# Patient Record
Sex: Male | Born: 1950 | Race: Black or African American | Hispanic: No | Marital: Married | Smoking: Never smoker
Health system: Southern US, Community
[De-identification: ages and names within clinical notes are randomized; demographics above are authoritative.]

## PROBLEM LIST (undated history)

## (undated) ENCOUNTER — Emergency Department: Payer: Medicare Other

## (undated) DIAGNOSIS — I71019 Dissection of thoracic aorta, unspecified: Secondary | ICD-10-CM

## (undated) DIAGNOSIS — N183 Chronic kidney disease, stage 3 unspecified: Secondary | ICD-10-CM

## (undated) DIAGNOSIS — E119 Type 2 diabetes mellitus without complications: Secondary | ICD-10-CM

## (undated) DIAGNOSIS — D689 Coagulation defect, unspecified: Secondary | ICD-10-CM

## (undated) DIAGNOSIS — G4730 Sleep apnea, unspecified: Secondary | ICD-10-CM

## (undated) DIAGNOSIS — R011 Cardiac murmur, unspecified: Secondary | ICD-10-CM

## (undated) DIAGNOSIS — I499 Cardiac arrhythmia, unspecified: Secondary | ICD-10-CM

## (undated) DIAGNOSIS — I10 Essential (primary) hypertension: Secondary | ICD-10-CM

## (undated) DIAGNOSIS — I7101 Dissection of thoracic aorta: Secondary | ICD-10-CM

## (undated) HISTORY — PX: REPAIR OF ACUTE ASCENDING THORACIC AORTIC DISSECTION: SHX6323

---

## 2014-09-16 DIAGNOSIS — Z95828 Presence of other vascular implants and grafts: Secondary | ICD-10-CM | POA: Insufficient documentation

## 2014-09-16 DIAGNOSIS — I7100 Dissection of unspecified site of aorta: Secondary | ICD-10-CM | POA: Insufficient documentation

## 2014-11-06 ENCOUNTER — Encounter (HOSPITAL_COMMUNITY)
Admission: RE | Admit: 2014-11-06 | Discharge: 2014-11-06 | Disposition: A | Discharge status: Home / Self Care | Payer: BLUE CROSS/BLUE SHIELD | Source: Ambulatory Visit | Attending: Cardiology | Admitting: Cardiology

## 2014-11-06 NOTE — Progress Notes (Signed)
Cardiac Rehab Medication Review by a Pharmacist  Does the patient  feel that his/her medications are working for him/her?  yes  Has the patient been experiencing any side effects to the medications prescribed?  no  Does the patient measure his/her own blood pressure or blood glucose at home?  yes   Does the patient have any problems obtaining medications due to transportation or finances?   no  Understanding of regimen: fair Understanding of indications: fair Potential of compliance: excellent    Pharmacist comments:  64 yo M presents to cardiac rehab accompanied by his wife. Patient in good spirits and explains that his wife handles all of his medications. Patient takes both his blood glucose and blood pressure every night. He explains that he doesn't have a good understanding of his regimen but his wife does. She verbalzies that she has a good system in place to help remember when to take all of his mediations. Medication list updated as well as allergies and pharmacy. All questions answered.  Thank you for allowing pharmacy to be part of this patient's care team  Junaid Wurzer M. Alianys Chacko, Pharm.D Clinical Pharmacy Resident Pager: [phone] 11/06/2014 .9:00 AM

## 2014-11-12 ENCOUNTER — Encounter (HOSPITAL_COMMUNITY): Payer: BLUE CROSS/BLUE SHIELD

## 2014-11-14 ENCOUNTER — Encounter (HOSPITAL_COMMUNITY): Payer: BLUE CROSS/BLUE SHIELD

## 2014-11-17 ENCOUNTER — Encounter (HOSPITAL_COMMUNITY)
Admission: RE | Admit: 2014-11-17 | Discharge: 2014-11-17 | Disposition: A | Discharge status: Home / Self Care | Payer: BLUE CROSS/BLUE SHIELD | Source: Ambulatory Visit | Attending: Cardiology | Admitting: Cardiology

## 2014-11-17 ENCOUNTER — Encounter (HOSPITAL_COMMUNITY): Payer: Self-pay

## 2014-11-17 DIAGNOSIS — I2699 Other pulmonary embolism without acute cor pulmonale: Secondary | ICD-10-CM | POA: Insufficient documentation

## 2014-11-17 DIAGNOSIS — G4733 Obstructive sleep apnea (adult) (pediatric): Secondary | ICD-10-CM | POA: Insufficient documentation

## 2014-11-17 DIAGNOSIS — Z95828 Presence of other vascular implants and grafts: Secondary | ICD-10-CM | POA: Diagnosis not present

## 2014-11-17 DIAGNOSIS — E785 Hyperlipidemia, unspecified: Secondary | ICD-10-CM | POA: Insufficient documentation

## 2014-11-17 DIAGNOSIS — I480 Paroxysmal atrial fibrillation: Secondary | ICD-10-CM

## 2014-11-17 DIAGNOSIS — I10 Essential (primary) hypertension: Secondary | ICD-10-CM

## 2014-11-17 DIAGNOSIS — I82409 Acute embolism and thrombosis of unspecified deep veins of unspecified lower extremity: Secondary | ICD-10-CM | POA: Insufficient documentation

## 2014-11-17 DIAGNOSIS — E1110 Type 2 diabetes mellitus with ketoacidosis without coma: Secondary | ICD-10-CM

## 2014-11-17 DIAGNOSIS — I7100 Dissection of unspecified site of aorta: Secondary | ICD-10-CM

## 2014-11-17 DIAGNOSIS — E119 Type 2 diabetes mellitus without complications: Secondary | ICD-10-CM | POA: Insufficient documentation

## 2014-11-17 DIAGNOSIS — I82401 Acute embolism and thrombosis of unspecified deep veins of right lower extremity: Secondary | ICD-10-CM

## 2014-11-17 DIAGNOSIS — I4891 Unspecified atrial fibrillation: Secondary | ICD-10-CM | POA: Insufficient documentation

## 2014-11-17 HISTORY — DX: Essential (primary) hypertension: I10

## 2014-11-17 HISTORY — DX: Cardiac arrhythmia, unspecified: I49.9

## 2014-11-17 HISTORY — DX: Coagulation defect, unspecified: D68.9

## 2014-11-17 HISTORY — DX: Type 2 diabetes mellitus without complications: E11.9

## 2014-11-17 LAB — GLUCOSE, CAPILLARY
Glucose-Capillary: 151 mg/dL — ABNORMAL HIGH (ref 70–99)
Glucose-Capillary: 129 mg/dL — ABNORMAL HIGH (ref 70–99)

## 2014-11-17 NOTE — Progress Notes (Signed)
Pt started cardiac rehab today.  Pt tolerated light exercise without difficulty.  VSS, telemetry-sinus rhythm, non specific STT changes, t wave inversion.  Asymptomatic.  PHQ-0.  Pt has no barriers to rehab participation, exhibits positive coping skills, hopeful outlook and supportive family.  Pt enjoys watching action movies for entertainment.  Pt goals for cardiac rehabilitation are to reduce cardiac risk factors, lower his diabetic medication dosage and lose weight, preferably about 15 pounds. Pt oriented to exercise equipment and routine.  Understanding verbalized.

## 2014-11-19 ENCOUNTER — Encounter (HOSPITAL_COMMUNITY)
Admission: RE | Admit: 2014-11-19 | Discharge: 2014-11-19 | Disposition: A | Discharge status: Home / Self Care | Payer: BLUE CROSS/BLUE SHIELD | Source: Ambulatory Visit | Attending: Cardiology | Admitting: Cardiology

## 2014-11-19 DIAGNOSIS — I7100 Dissection of unspecified site of aorta: Secondary | ICD-10-CM | POA: Diagnosis not present

## 2014-11-19 LAB — GLUCOSE, CAPILLARY
Glucose-Capillary: 101 mg/dL — ABNORMAL HIGH (ref 70–99)
Glucose-Capillary: 112 mg/dL — ABNORMAL HIGH (ref 70–99)

## 2014-11-19 NOTE — Progress Notes (Signed)
Pt reports his PCP is Jadene Pierini, MD.  This is who manages his diabetes.

## 2014-11-21 ENCOUNTER — Encounter (HOSPITAL_COMMUNITY): Payer: BLUE CROSS/BLUE SHIELD

## 2014-11-24 ENCOUNTER — Encounter (HOSPITAL_COMMUNITY)
Admission: RE | Admit: 2014-11-24 | Discharge: 2014-11-24 | Disposition: A | Discharge status: Home / Self Care | Payer: BLUE CROSS/BLUE SHIELD | Source: Ambulatory Visit | Attending: Cardiology | Admitting: Cardiology

## 2014-11-24 DIAGNOSIS — I7100 Dissection of unspecified site of aorta: Secondary | ICD-10-CM | POA: Diagnosis not present

## 2014-11-25 LAB — GLUCOSE, CAPILLARY
GLUCOSE-CAPILLARY: 122 mg/dL — AB (ref 70–99)
Glucose-Capillary: 113 mg/dL — ABNORMAL HIGH (ref 70–99)

## 2014-11-26 ENCOUNTER — Encounter (HOSPITAL_COMMUNITY)
Admission: RE | Admit: 2014-11-26 | Discharge: 2014-11-26 | Disposition: A | Discharge status: Home / Self Care | Payer: BLUE CROSS/BLUE SHIELD | Source: Ambulatory Visit | Attending: Cardiology | Admitting: Cardiology

## 2014-11-26 DIAGNOSIS — I7100 Dissection of unspecified site of aorta: Secondary | ICD-10-CM | POA: Diagnosis not present

## 2014-11-26 LAB — GLUCOSE, CAPILLARY: Glucose-Capillary: 123 mg/dL — ABNORMAL HIGH (ref 70–99)

## 2014-11-28 ENCOUNTER — Encounter (HOSPITAL_COMMUNITY)
Admission: RE | Admit: 2014-11-28 | Discharge: 2014-11-28 | Disposition: A | Discharge status: Home / Self Care | Payer: BLUE CROSS/BLUE SHIELD | Source: Ambulatory Visit | Attending: Cardiology | Admitting: Cardiology

## 2014-11-28 DIAGNOSIS — I7100 Dissection of unspecified site of aorta: Secondary | ICD-10-CM | POA: Diagnosis not present

## 2014-11-28 LAB — GLUCOSE, CAPILLARY: Glucose-Capillary: 138 mg/dL — ABNORMAL HIGH (ref 70–99)

## 2014-12-01 ENCOUNTER — Encounter (HOSPITAL_COMMUNITY): Payer: BLUE CROSS/BLUE SHIELD

## 2014-12-03 ENCOUNTER — Encounter (HOSPITAL_COMMUNITY)
Admission: RE | Admit: 2014-12-03 | Discharge: 2014-12-03 | Disposition: A | Discharge status: Home / Self Care | Payer: BLUE CROSS/BLUE SHIELD | Source: Ambulatory Visit | Attending: Cardiology | Admitting: Cardiology

## 2014-12-03 DIAGNOSIS — I7100 Dissection of unspecified site of aorta: Secondary | ICD-10-CM | POA: Diagnosis not present

## 2014-12-04 LAB — GLUCOSE, CAPILLARY: Glucose-Capillary: 176 mg/dL — ABNORMAL HIGH (ref 70–99)

## 2014-12-05 ENCOUNTER — Encounter (HOSPITAL_COMMUNITY)
Admission: RE | Admit: 2014-12-05 | Discharge: 2014-12-05 | Disposition: A | Discharge status: Home / Self Care | Payer: BLUE CROSS/BLUE SHIELD | Source: Ambulatory Visit | Attending: Cardiology | Admitting: Cardiology

## 2014-12-05 DIAGNOSIS — I7100 Dissection of unspecified site of aorta: Secondary | ICD-10-CM | POA: Diagnosis not present

## 2014-12-08 ENCOUNTER — Encounter (HOSPITAL_COMMUNITY)
Admission: RE | Admit: 2014-12-08 | Discharge: 2014-12-08 | Disposition: A | Discharge status: Home / Self Care | Payer: BLUE CROSS/BLUE SHIELD | Source: Ambulatory Visit | Attending: Cardiology | Admitting: Cardiology

## 2014-12-08 DIAGNOSIS — I7100 Dissection of unspecified site of aorta: Secondary | ICD-10-CM | POA: Diagnosis not present

## 2014-12-08 LAB — GLUCOSE, CAPILLARY: Glucose-Capillary: 108 mg/dL — ABNORMAL HIGH (ref 70–99)

## 2014-12-10 ENCOUNTER — Encounter (HOSPITAL_COMMUNITY)
Admission: RE | Admit: 2014-12-10 | Discharge: 2014-12-10 | Disposition: A | Discharge status: Home / Self Care | Payer: BLUE CROSS/BLUE SHIELD | Source: Ambulatory Visit | Attending: Cardiology | Admitting: Cardiology

## 2014-12-10 DIAGNOSIS — I7100 Dissection of unspecified site of aorta: Secondary | ICD-10-CM | POA: Diagnosis not present

## 2014-12-10 NOTE — Progress Notes (Signed)
PSYCHOSOCIAL ASSESSMENT  Pt psychosocial assessment reveals no barriers to rehab participation.  Pt quality of life is slightly altered by his physical constraints which limits his ability to perform tasks as prior to his illness. However pt reports since he has been exercising at cardiac rehab some of these symptoms have resolved or improved. Pt reports increased appetite, improved sleep quality and more energy for activities.    Pt exhibits positive coping skills and has supportive family.  Offered emotional support and reassurance.  Will continue to monitor.

## 2014-12-12 ENCOUNTER — Encounter (HOSPITAL_COMMUNITY): Payer: BLUE CROSS/BLUE SHIELD

## 2014-12-12 DIAGNOSIS — E669 Obesity, unspecified: Secondary | ICD-10-CM | POA: Insufficient documentation

## 2014-12-15 ENCOUNTER — Encounter (HOSPITAL_COMMUNITY)
Admission: RE | Admit: 2014-12-15 | Discharge: 2014-12-15 | Disposition: A | Discharge status: Home / Self Care | Payer: BLUE CROSS/BLUE SHIELD | Source: Ambulatory Visit | Attending: Cardiology | Admitting: Cardiology

## 2014-12-15 DIAGNOSIS — I7100 Dissection of unspecified site of aorta: Secondary | ICD-10-CM | POA: Diagnosis not present

## 2014-12-17 ENCOUNTER — Encounter (HOSPITAL_COMMUNITY)
Admission: RE | Admit: 2014-12-17 | Discharge: 2014-12-17 | Disposition: A | Discharge status: Home / Self Care | Payer: BLUE CROSS/BLUE SHIELD | Source: Ambulatory Visit | Attending: Cardiology | Admitting: Cardiology

## 2014-12-17 DIAGNOSIS — I7100 Dissection of unspecified site of aorta: Secondary | ICD-10-CM | POA: Insufficient documentation

## 2014-12-17 DIAGNOSIS — Z95828 Presence of other vascular implants and grafts: Secondary | ICD-10-CM | POA: Insufficient documentation

## 2014-12-17 DIAGNOSIS — I480 Paroxysmal atrial fibrillation: Secondary | ICD-10-CM | POA: Insufficient documentation

## 2014-12-17 DIAGNOSIS — I10 Essential (primary) hypertension: Secondary | ICD-10-CM | POA: Diagnosis not present

## 2014-12-17 DIAGNOSIS — E785 Hyperlipidemia, unspecified: Secondary | ICD-10-CM | POA: Insufficient documentation

## 2014-12-19 ENCOUNTER — Encounter (HOSPITAL_COMMUNITY)
Admission: RE | Admit: 2014-12-19 | Discharge: 2014-12-19 | Disposition: A | Discharge status: Home / Self Care | Payer: BLUE CROSS/BLUE SHIELD | Source: Ambulatory Visit | Attending: Cardiology | Admitting: Cardiology

## 2014-12-19 DIAGNOSIS — I7100 Dissection of unspecified site of aorta: Secondary | ICD-10-CM | POA: Diagnosis not present

## 2014-12-22 ENCOUNTER — Encounter (HOSPITAL_COMMUNITY)
Admission: RE | Admit: 2014-12-22 | Discharge: 2014-12-22 | Disposition: A | Discharge status: Home / Self Care | Payer: BLUE CROSS/BLUE SHIELD | Source: Ambulatory Visit | Attending: Cardiology | Admitting: Cardiology

## 2014-12-22 DIAGNOSIS — I7100 Dissection of unspecified site of aorta: Secondary | ICD-10-CM | POA: Diagnosis not present

## 2014-12-22 LAB — GLUCOSE, CAPILLARY
GLUCOSE-CAPILLARY: 86 mg/dL (ref 70–99)
Glucose-Capillary: 113 mg/dL — ABNORMAL HIGH (ref 70–99)

## 2014-12-22 NOTE — Progress Notes (Signed)
Benjamin Blair 64 y.o. male Nutrition Note Spoke with pt.  Nutrition Plan and Nutrition Survey goals reviewed with pt. Pt is working toward following the Therapeutic Lifestyle Changes diet. Pt wants to lose wt "and get off some of these medications." Pt has been trying to lose wt by changing his diet and exercising. Wt loss tips reviewed.  Pt is diabetic. Last A1c indicates blood glucose not optimally controlled. Pt checks post-prandial CBG's daily, which are "around 110 mg/dL 1-2 hours after eating." This writer went over Diabetes Education test results. Pt is on Coumadin and follows a diet consistent in vitamin K per discussion. Pt expressed understanding of the information reviewed. Pt aware of nutrition education classes offered and is unable to attend nutrition classes.  Nutrition Diagnosis ? Food-and nutrition-related knowledge deficit related to lack of exposure to information as related to diagnosis of: ? CVD ? DM (A1c 7.4) ? Obesity related to excessive energy intake as evidenced by a BMI of 31.4  Nutrition RX/ Estimated Daily Nutrition Needs for: wt loss  1800-2300 Kcal, 45-60 gm fat, 11-15 gm sat fat, 1.7-2.3 gm trans-fat, <1500 mg sodium, 250 gm CHO   Nutrition Intervention ? Pt's individual nutrition plan reviewed with pt. ? Benefits of adopting Therapeutic Lifestyle Changes discussed when Medficts reviewed. ? Pt to attend the Portion Distortion class ? Pt to attend the Diabetes Q& A class ? Pt given handouts for: ? Nutrition I class ? Nutrition II class ? Diabetes Blitz class ? Continue client-centered nutrition education by RD, as part of interdisciplinary care. Goal(s) ? Pt to identify and limit food sources of saturated fat, trans fat, and cholesterol ? Pt to identify food quantities necessary to achieve: ? wt loss to a goal wt of 220-238 lb (100.2-108.4 kg) at graduation from cardiac rehab.  ? CBG concentrations in the normal range or as close to normal as is safely  possible. Monitor and Evaluate progress toward nutrition goal with team. Nutrition Risk: Change to Moderate Benjamin Blair, M.Ed, RD, LDN, CDE 12/22/2014 3:58 PM

## 2014-12-24 ENCOUNTER — Encounter (HOSPITAL_COMMUNITY)
Admission: RE | Admit: 2014-12-24 | Discharge: 2014-12-24 | Disposition: A | Discharge status: Home / Self Care | Payer: BLUE CROSS/BLUE SHIELD | Source: Ambulatory Visit | Attending: Cardiology | Admitting: Cardiology

## 2014-12-24 DIAGNOSIS — I7100 Dissection of unspecified site of aorta: Secondary | ICD-10-CM | POA: Diagnosis not present

## 2014-12-26 ENCOUNTER — Encounter (HOSPITAL_COMMUNITY)
Admission: RE | Admit: 2014-12-26 | Discharge: 2014-12-26 | Disposition: A | Discharge status: Home / Self Care | Payer: BLUE CROSS/BLUE SHIELD | Source: Ambulatory Visit | Attending: Cardiology | Admitting: Cardiology

## 2014-12-26 DIAGNOSIS — I7100 Dissection of unspecified site of aorta: Secondary | ICD-10-CM | POA: Diagnosis not present

## 2014-12-29 ENCOUNTER — Encounter (HOSPITAL_COMMUNITY)
Admission: RE | Admit: 2014-12-29 | Discharge: 2014-12-29 | Disposition: A | Discharge status: Home / Self Care | Payer: BLUE CROSS/BLUE SHIELD | Source: Ambulatory Visit | Attending: Cardiology | Admitting: Cardiology

## 2014-12-29 DIAGNOSIS — I7100 Dissection of unspecified site of aorta: Secondary | ICD-10-CM | POA: Diagnosis not present

## 2014-12-31 ENCOUNTER — Encounter (HOSPITAL_COMMUNITY)
Admission: RE | Admit: 2014-12-31 | Discharge: 2014-12-31 | Disposition: A | Discharge status: Home / Self Care | Payer: BLUE CROSS/BLUE SHIELD | Source: Ambulatory Visit | Attending: Cardiology | Admitting: Cardiology

## 2014-12-31 ENCOUNTER — Ambulatory Visit: Payer: BLUE CROSS/BLUE SHIELD | Admitting: Podiatry

## 2014-12-31 DIAGNOSIS — I7100 Dissection of unspecified site of aorta: Secondary | ICD-10-CM | POA: Diagnosis not present

## 2015-01-02 ENCOUNTER — Encounter (HOSPITAL_COMMUNITY): Payer: BLUE CROSS/BLUE SHIELD

## 2015-01-02 ENCOUNTER — Encounter (HOSPITAL_COMMUNITY)
Admission: RE | Admit: 2015-01-02 | Discharge: 2015-01-02 | Disposition: A | Discharge status: Home / Self Care | Payer: BLUE CROSS/BLUE SHIELD | Source: Ambulatory Visit | Attending: Cardiology | Admitting: Cardiology

## 2015-01-02 DIAGNOSIS — I7100 Dissection of unspecified site of aorta: Secondary | ICD-10-CM | POA: Diagnosis not present

## 2015-01-05 ENCOUNTER — Encounter (HOSPITAL_COMMUNITY)
Admission: RE | Admit: 2015-01-05 | Discharge: 2015-01-05 | Disposition: A | Discharge status: Home / Self Care | Payer: BLUE CROSS/BLUE SHIELD | Source: Ambulatory Visit | Attending: Cardiology | Admitting: Cardiology

## 2015-01-05 DIAGNOSIS — I7100 Dissection of unspecified site of aorta: Secondary | ICD-10-CM | POA: Diagnosis not present

## 2015-01-07 ENCOUNTER — Encounter (HOSPITAL_COMMUNITY)
Admission: RE | Admit: 2015-01-07 | Discharge: 2015-01-07 | Disposition: A | Discharge status: Home / Self Care | Payer: BLUE CROSS/BLUE SHIELD | Source: Ambulatory Visit | Attending: Cardiology | Admitting: Cardiology

## 2015-01-07 DIAGNOSIS — I7100 Dissection of unspecified site of aorta: Secondary | ICD-10-CM | POA: Diagnosis not present

## 2015-01-09 ENCOUNTER — Encounter (HOSPITAL_COMMUNITY)
Admission: RE | Admit: 2015-01-09 | Discharge: 2015-01-09 | Disposition: A | Discharge status: Home / Self Care | Payer: BLUE CROSS/BLUE SHIELD | Source: Ambulatory Visit | Attending: Cardiology | Admitting: Cardiology

## 2015-01-09 DIAGNOSIS — I7100 Dissection of unspecified site of aorta: Secondary | ICD-10-CM | POA: Diagnosis not present

## 2015-01-12 ENCOUNTER — Encounter (HOSPITAL_COMMUNITY)
Admission: RE | Admit: 2015-01-12 | Discharge: 2015-01-12 | Disposition: A | Discharge status: Home / Self Care | Payer: BLUE CROSS/BLUE SHIELD | Source: Ambulatory Visit | Attending: Cardiology | Admitting: Cardiology

## 2015-01-12 DIAGNOSIS — I7100 Dissection of unspecified site of aorta: Secondary | ICD-10-CM | POA: Diagnosis not present

## 2015-01-12 LAB — GLUCOSE, CAPILLARY: GLUCOSE-CAPILLARY: 103 mg/dL — AB (ref 70–99)

## 2015-01-14 ENCOUNTER — Encounter (HOSPITAL_COMMUNITY)
Admission: RE | Admit: 2015-01-14 | Discharge: 2015-01-14 | Disposition: A | Discharge status: Home / Self Care | Payer: BLUE CROSS/BLUE SHIELD | Source: Ambulatory Visit | Attending: Cardiology | Admitting: Cardiology

## 2015-01-14 DIAGNOSIS — I7100 Dissection of unspecified site of aorta: Secondary | ICD-10-CM | POA: Diagnosis not present

## 2015-01-16 ENCOUNTER — Encounter (HOSPITAL_COMMUNITY)
Admission: RE | Admit: 2015-01-16 | Discharge: 2015-01-16 | Disposition: A | Discharge status: Home / Self Care | Payer: BLUE CROSS/BLUE SHIELD | Source: Ambulatory Visit | Attending: Cardiology | Admitting: Cardiology

## 2015-01-16 DIAGNOSIS — Z95828 Presence of other vascular implants and grafts: Secondary | ICD-10-CM | POA: Diagnosis not present

## 2015-01-16 DIAGNOSIS — I7100 Dissection of unspecified site of aorta: Secondary | ICD-10-CM | POA: Insufficient documentation

## 2015-01-16 DIAGNOSIS — I480 Paroxysmal atrial fibrillation: Secondary | ICD-10-CM | POA: Insufficient documentation

## 2015-01-16 DIAGNOSIS — I10 Essential (primary) hypertension: Secondary | ICD-10-CM | POA: Diagnosis not present

## 2015-01-16 DIAGNOSIS — E785 Hyperlipidemia, unspecified: Secondary | ICD-10-CM | POA: Insufficient documentation

## 2015-01-19 ENCOUNTER — Encounter (HOSPITAL_COMMUNITY)
Admission: RE | Admit: 2015-01-19 | Discharge: 2015-01-19 | Disposition: A | Discharge status: Home / Self Care | Payer: BLUE CROSS/BLUE SHIELD | Source: Ambulatory Visit | Attending: Cardiology | Admitting: Cardiology

## 2015-01-19 DIAGNOSIS — I7100 Dissection of unspecified site of aorta: Secondary | ICD-10-CM | POA: Diagnosis not present

## 2015-01-21 ENCOUNTER — Encounter (HOSPITAL_COMMUNITY)
Admission: RE | Admit: 2015-01-21 | Discharge: 2015-01-21 | Disposition: A | Discharge status: Home / Self Care | Payer: BLUE CROSS/BLUE SHIELD | Source: Ambulatory Visit | Attending: Cardiology | Admitting: Cardiology

## 2015-01-21 DIAGNOSIS — I7100 Dissection of unspecified site of aorta: Secondary | ICD-10-CM | POA: Diagnosis not present

## 2015-01-23 ENCOUNTER — Encounter (HOSPITAL_COMMUNITY)
Admission: RE | Admit: 2015-01-23 | Discharge: 2015-01-23 | Disposition: A | Discharge status: Home / Self Care | Payer: BLUE CROSS/BLUE SHIELD | Source: Ambulatory Visit | Attending: Cardiology | Admitting: Cardiology

## 2015-01-23 DIAGNOSIS — I7100 Dissection of unspecified site of aorta: Secondary | ICD-10-CM | POA: Diagnosis not present

## 2015-01-26 ENCOUNTER — Encounter (HOSPITAL_COMMUNITY)
Admission: RE | Admit: 2015-01-26 | Discharge: 2015-01-26 | Disposition: A | Discharge status: Home / Self Care | Payer: BLUE CROSS/BLUE SHIELD | Source: Ambulatory Visit | Attending: Cardiology | Admitting: Cardiology

## 2015-01-26 DIAGNOSIS — I7100 Dissection of unspecified site of aorta: Secondary | ICD-10-CM | POA: Diagnosis not present

## 2015-01-28 ENCOUNTER — Encounter (HOSPITAL_COMMUNITY)
Admission: RE | Admit: 2015-01-28 | Discharge: 2015-01-28 | Disposition: A | Discharge status: Home / Self Care | Payer: BLUE CROSS/BLUE SHIELD | Source: Ambulatory Visit | Attending: Cardiology | Admitting: Cardiology

## 2015-01-28 DIAGNOSIS — I7100 Dissection of unspecified site of aorta: Secondary | ICD-10-CM | POA: Diagnosis not present

## 2015-01-28 LAB — GLUCOSE, CAPILLARY: GLUCOSE-CAPILLARY: 106 mg/dL — AB (ref 70–99)

## 2015-01-30 ENCOUNTER — Encounter (HOSPITAL_COMMUNITY)
Admission: RE | Admit: 2015-01-30 | Discharge: 2015-01-30 | Disposition: A | Discharge status: Home / Self Care | Payer: BLUE CROSS/BLUE SHIELD | Source: Ambulatory Visit | Attending: Cardiology | Admitting: Cardiology

## 2015-01-30 DIAGNOSIS — I7100 Dissection of unspecified site of aorta: Secondary | ICD-10-CM | POA: Diagnosis not present

## 2015-01-30 LAB — GLUCOSE, CAPILLARY: Glucose-Capillary: 137 mg/dL — ABNORMAL HIGH (ref 70–99)

## 2015-02-02 ENCOUNTER — Encounter (HOSPITAL_COMMUNITY)
Admission: RE | Admit: 2015-02-02 | Discharge: 2015-02-02 | Disposition: A | Discharge status: Home / Self Care | Payer: BLUE CROSS/BLUE SHIELD | Source: Ambulatory Visit | Attending: Cardiology | Admitting: Cardiology

## 2015-02-02 DIAGNOSIS — I7100 Dissection of unspecified site of aorta: Secondary | ICD-10-CM | POA: Diagnosis not present

## 2015-02-02 LAB — GLUCOSE, CAPILLARY: Glucose-Capillary: 140 mg/dL — ABNORMAL HIGH (ref 70–99)

## 2015-02-04 ENCOUNTER — Encounter (HOSPITAL_COMMUNITY)
Admission: RE | Admit: 2015-02-04 | Discharge: 2015-02-04 | Disposition: A | Discharge status: Home / Self Care | Payer: BLUE CROSS/BLUE SHIELD | Source: Ambulatory Visit | Attending: Cardiology | Admitting: Cardiology

## 2015-02-04 DIAGNOSIS — I7100 Dissection of unspecified site of aorta: Secondary | ICD-10-CM | POA: Diagnosis not present

## 2015-02-04 LAB — GLUCOSE, CAPILLARY: Glucose-Capillary: 87 mg/dL (ref 70–99)

## 2015-02-06 ENCOUNTER — Encounter (HOSPITAL_COMMUNITY)
Admission: RE | Admit: 2015-02-06 | Discharge: 2015-02-06 | Disposition: A | Discharge status: Home / Self Care | Payer: BLUE CROSS/BLUE SHIELD | Source: Ambulatory Visit | Attending: Cardiology | Admitting: Cardiology

## 2015-02-06 DIAGNOSIS — I7100 Dissection of unspecified site of aorta: Secondary | ICD-10-CM | POA: Diagnosis not present

## 2015-02-09 ENCOUNTER — Encounter (HOSPITAL_COMMUNITY)
Admission: RE | Admit: 2015-02-09 | Discharge: 2015-02-09 | Disposition: A | Discharge status: Home / Self Care | Payer: BLUE CROSS/BLUE SHIELD | Source: Ambulatory Visit | Attending: Interventional Cardiology | Admitting: Interventional Cardiology

## 2015-02-09 DIAGNOSIS — I7100 Dissection of unspecified site of aorta: Secondary | ICD-10-CM | POA: Diagnosis not present

## 2015-02-09 LAB — GLUCOSE, CAPILLARY
GLUCOSE-CAPILLARY: 88 mg/dL (ref 70–99)
Glucose-Capillary: 88 mg/dL (ref 70–99)

## 2015-02-11 ENCOUNTER — Encounter (HOSPITAL_COMMUNITY)
Admission: RE | Admit: 2015-02-11 | Discharge: 2015-02-11 | Disposition: A | Discharge status: Home / Self Care | Payer: BLUE CROSS/BLUE SHIELD | Source: Ambulatory Visit | Attending: Cardiology | Admitting: Cardiology

## 2015-02-11 DIAGNOSIS — I7100 Dissection of unspecified site of aorta: Secondary | ICD-10-CM | POA: Diagnosis not present

## 2015-02-11 LAB — GLUCOSE, CAPILLARY: Glucose-Capillary: 146 mg/dL — ABNORMAL HIGH (ref 70–99)

## 2015-02-13 ENCOUNTER — Encounter (HOSPITAL_COMMUNITY): Payer: Self-pay

## 2015-02-13 ENCOUNTER — Encounter (HOSPITAL_COMMUNITY)
Admission: RE | Admit: 2015-02-13 | Discharge: 2015-02-13 | Disposition: A | Discharge status: Home / Self Care | Payer: BLUE CROSS/BLUE SHIELD | Source: Ambulatory Visit | Attending: Cardiology | Admitting: Cardiology

## 2015-02-13 DIAGNOSIS — I7100 Dissection of unspecified site of aorta: Secondary | ICD-10-CM | POA: Diagnosis not present

## 2015-02-13 LAB — GLUCOSE, CAPILLARY: Glucose-Capillary: 110 mg/dL — ABNORMAL HIGH (ref 70–99)

## 2015-02-13 NOTE — Progress Notes (Signed)
Pt graduated from cardiac rehab program today with completion of 36 exercise sessions in Phase II. Pt maintained good attendance and progressed nicely during his participation in rehab as evidenced by increased MET level.   Medication list reconciled. Repeat  PSYSHOSOCIAL ASSESSMENT:  PHQ score-0.  No needs identified, no interventions necessary.   Pt has made significant lifestyle changes and should be commended for his success. Pt feels he has achieved his goals during cardiac rehab, especially weight loss(20 lbs!) and increased stregth/stamina and endurance. Pt also hopes he has been able to lower his HgbAIC which will be reassessed at scheduled June PCP appt.   Pt congratulated on his success and encouraged to continue his lifestyle modifications and exercise routine.    Pt plans to continue exercise on his own at home.

## 2015-02-16 ENCOUNTER — Encounter (HOSPITAL_COMMUNITY): Payer: BLUE CROSS/BLUE SHIELD

## 2015-07-20 DIAGNOSIS — N401 Enlarged prostate with lower urinary tract symptoms: Secondary | ICD-10-CM | POA: Insufficient documentation

## 2015-07-20 DIAGNOSIS — R32 Unspecified urinary incontinence: Secondary | ICD-10-CM | POA: Insufficient documentation

## 2015-07-20 DIAGNOSIS — N529 Male erectile dysfunction, unspecified: Secondary | ICD-10-CM | POA: Insufficient documentation

## 2015-08-03 ENCOUNTER — Ambulatory Visit: Payer: BLUE CROSS/BLUE SHIELD | Admitting: Podiatry

## 2015-08-03 ENCOUNTER — Ambulatory Visit (INDEPENDENT_AMBULATORY_CARE_PROVIDER_SITE_OTHER): Payer: BLUE CROSS/BLUE SHIELD | Admitting: Podiatry

## 2015-08-03 ENCOUNTER — Encounter: Payer: Self-pay | Admitting: Podiatry

## 2015-08-03 DIAGNOSIS — M79676 Pain in unspecified toe(s): Secondary | ICD-10-CM | POA: Diagnosis not present

## 2015-08-03 DIAGNOSIS — B351 Tinea unguium: Secondary | ICD-10-CM

## 2015-08-03 NOTE — Patient Instructions (Signed)
Diabetes and Foot Care Diabetes may cause you to have problems because of poor blood supply (circulation) to your feet and legs. This may cause the skin on your feet to become thinner, break easier, and heal more slowly. Your skin may become dry, and the skin may peel and crack. You may also have nerve damage in your legs and feet causing decreased feeling in them. You may not notice minor injuries to your feet that could lead to infections or more serious problems. Taking care of your feet is one of the most important things you can do for yourself.  HOME CARE INSTRUCTIONS  Wear shoes at all times, even in the house. Do not go barefoot. Bare feet are easily injured.  Check your feet daily for blisters, cuts, and redness. If you cannot see the bottom of your feet, use a mirror or ask someone for help.  Wash your feet with warm water (do not use hot water) and mild soap. Then pat your feet and the areas between your toes until they are completely dry. Do not soak your feet as this can dry your skin.  Apply a moisturizing lotion or petroleum jelly (that does not contain alcohol and is unscented) to the skin on your feet and to dry, brittle toenails. Do not apply lotion between your toes.  Trim your toenails straight across. Do not dig under them or around the cuticle. File the edges of your nails with an emery board or nail file.  Do not cut corns or calluses or try to remove them with medicine.  Wear clean socks or stockings every day. Make sure they are not too tight. Do not wear knee-high stockings since they may decrease blood flow to your legs.  Wear shoes that fit properly and have enough cushioning. To break in new shoes, wear them for just a few hours a day. This prevents you from injuring your feet. Always look in your shoes before you put them on to be sure there are no objects inside.  Do not cross your legs. This may decrease the blood flow to your feet.  If you find a minor scrape,  cut, or break in the skin on your feet, keep it and the skin around it clean and dry. These areas may be cleansed with mild soap and water. Do not cleanse the area with peroxide, alcohol, or iodine.  When you remove an adhesive bandage, be sure not to damage the skin around it.  If you have a wound, look at it several times a day to make sure it is healing.  Do not use heating pads or hot water bottles. They may burn your skin. If you have lost feeling in your feet or legs, you may not know it is happening until it is too late.  Make sure your health care provider performs a complete foot exam at least annually or more often if you have foot problems. Report any cuts, sores, or bruises to your health care provider immediately. SEEK MEDICAL CARE IF:   You have an injury that is not healing.  You have cuts or breaks in the skin.  You have an ingrown nail.  You notice redness on your legs or feet.  You feel burning or tingling in your legs or feet.  You have pain or cramps in your legs and feet.  Your legs or feet are numb.  Your feet always feel cold. SEEK IMMEDIATE MEDICAL CARE IF:   There is increasing redness,   swelling, or pain in or around a wound.  There is a red line that goes up your leg.  Pus is coming from a wound.  You develop a fever or as directed by your health care provider.  You notice a bad smell coming from an ulcer or wound.   This information is not intended to replace advice given to you by your health care provider. Make sure you discuss any questions you have with your health care provider.   Document Released: 09/30/2000 Document Revised: 06/05/2013 Document Reviewed: 03/12/2013 Elsevier Interactive Patient Education 2016 Elsevier Inc.  

## 2015-08-04 NOTE — Progress Notes (Signed)
Subjective:     Patient ID: Benjamin Blair, male   DOB: 1951-07-07, 64 y.o.   MRN: [id number]  HPI Patient presents the office today with concerns of thick, painful, elongated toenails which she is unable to trim himself. He denies any redness or drainage from around the nail sites. The nails are painful particularly with pressure in shoe gear. He is diabetic and states his blood sugar has been controlled. He denies any claudication symptoms and denies any tingling or numbness. No history of ulceration. No other complaints at this time.  Review of Systems  All other systems reviewed and are negative.      Objective:   Physical Exam  AAO 3, NAD DP/PT pulses 2/4, CRT less than 3 seconds Protective sensation intact with Simms Weinstein monofilament, vibratory sensation intact, Achilles tendon reflex intact. Nails are hypertrophic, dystrophic, brittle, discolored, elongated 10. There is tenderness to palpation overlying nails 1-5 bilaterally. No surrounding erythema or drainage. No open lesions or pre-ulcerative lesions identified bilaterally. No other areas of tenderness to bilateral lower extremity. MMT 5/5, ROM WNL No pain with calf compression, swelling, warmth, erythema.    Assessment:     64 year old male with symptomatic onychomycosis    Plan:     -Treatment options discussed including all alternatives, risks, and complications -Nail sharply debrided 10 without complication/bleeding. -Discussed the importance of daily foot inspection. -Follow-up in 3 months or sooner if any problems arise. In the meantime, encouraged to call the office with any questions, concerns, change in symptoms.   Celesta Gentile, DPM

## 2015-08-17 DIAGNOSIS — N3946 Mixed incontinence: Secondary | ICD-10-CM | POA: Insufficient documentation

## 2015-08-24 ENCOUNTER — Emergency Department (HOSPITAL_COMMUNITY)
Admission: EM | Admit: 2015-08-24 | Discharge: 2015-08-24 | Disposition: A | Discharge status: Home / Self Care | Payer: BLUE CROSS/BLUE SHIELD | Attending: Emergency Medicine | Admitting: Emergency Medicine

## 2015-08-24 ENCOUNTER — Encounter (HOSPITAL_COMMUNITY): Payer: Self-pay | Admitting: Emergency Medicine

## 2015-08-24 DIAGNOSIS — X58XXXA Exposure to other specified factors, initial encounter: Secondary | ICD-10-CM | POA: Insufficient documentation

## 2015-08-24 DIAGNOSIS — T7840XA Allergy, unspecified, initial encounter: Secondary | ICD-10-CM | POA: Insufficient documentation

## 2015-08-24 DIAGNOSIS — I4891 Unspecified atrial fibrillation: Secondary | ICD-10-CM | POA: Insufficient documentation

## 2015-08-24 DIAGNOSIS — Z86711 Personal history of pulmonary embolism: Secondary | ICD-10-CM | POA: Insufficient documentation

## 2015-08-24 DIAGNOSIS — Z7901 Long term (current) use of anticoagulants: Secondary | ICD-10-CM | POA: Insufficient documentation

## 2015-08-24 DIAGNOSIS — Z86718 Personal history of other venous thrombosis and embolism: Secondary | ICD-10-CM | POA: Insufficient documentation

## 2015-08-24 DIAGNOSIS — Z7982 Long term (current) use of aspirin: Secondary | ICD-10-CM | POA: Insufficient documentation

## 2015-08-24 DIAGNOSIS — L500 Allergic urticaria: Secondary | ICD-10-CM | POA: Insufficient documentation

## 2015-08-24 DIAGNOSIS — Z79899 Other long term (current) drug therapy: Secondary | ICD-10-CM | POA: Insufficient documentation

## 2015-08-24 DIAGNOSIS — L509 Urticaria, unspecified: Secondary | ICD-10-CM

## 2015-08-24 DIAGNOSIS — I10 Essential (primary) hypertension: Secondary | ICD-10-CM | POA: Insufficient documentation

## 2015-08-24 DIAGNOSIS — E119 Type 2 diabetes mellitus without complications: Secondary | ICD-10-CM | POA: Insufficient documentation

## 2015-08-24 LAB — CBC WITH DIFFERENTIAL/PLATELET
Basophils Absolute: 0 10*3/uL (ref 0.0–0.1)
Basophils Relative: 0 %
EOS PCT: 2 %
Eosinophils Absolute: 0.1 10*3/uL (ref 0.0–0.7)
HCT: 39.6 % (ref 39.0–52.0)
HEMOGLOBIN: 12.5 g/dL — AB (ref 13.0–17.0)
LYMPHS ABS: 1 10*3/uL (ref 0.7–4.0)
LYMPHS PCT: 18 %
MCH: 25.3 pg — AB (ref 26.0–34.0)
MCHC: 31.6 g/dL (ref 30.0–36.0)
MCV: 80 fL (ref 78.0–100.0)
MONOS PCT: 11 %
Monocytes Absolute: 0.6 10*3/uL (ref 0.1–1.0)
Neutro Abs: 4 10*3/uL (ref 1.7–7.7)
Neutrophils Relative %: 69 %
Platelets: 232 10*3/uL (ref 150–400)
RBC: 4.95 MIL/uL (ref 4.22–5.81)
RDW: 16 % — AB (ref 11.5–15.5)
WBC: 5.8 10*3/uL (ref 4.0–10.5)

## 2015-08-24 LAB — BASIC METABOLIC PANEL
Anion gap: 7 (ref 5–15)
BUN: 21 mg/dL — ABNORMAL HIGH (ref 6–20)
CHLORIDE: 108 mmol/L (ref 101–111)
CO2: 24 mmol/L (ref 22–32)
Calcium: 8.8 mg/dL — ABNORMAL LOW (ref 8.9–10.3)
Creatinine, Ser: 1.89 mg/dL — ABNORMAL HIGH (ref 0.61–1.24)
GFR calc non Af Amer: 36 mL/min — ABNORMAL LOW (ref >60)
GFR, EST AFRICAN AMERICAN: 42 mL/min — AB (ref >60)
Glucose, Bld: 104 mg/dL — ABNORMAL HIGH (ref 65–99)
POTASSIUM: 4.3 mmol/L (ref 3.5–5.1)
SODIUM: 139 mmol/L (ref 135–145)

## 2015-08-24 LAB — PROTIME-INR
INR: 2.19 — ABNORMAL HIGH (ref 0.00–1.49)
Prothrombin Time: 24.2 seconds — ABNORMAL HIGH (ref 11.6–15.2)

## 2015-08-24 MED ORDER — DIPHENHYDRAMINE HCL 25 MG PO TABS
50.0000 mg | ORAL_TABLET | Freq: Four times a day (QID) | ORAL | Status: AC | PRN
Start: 2015-08-24 — End: ?

## 2015-08-24 NOTE — Discharge Instructions (Signed)
Please take Benadryl as needed for recurrent itchiness and hives. Return without fail for worsening symptoms, including difficulty breathing, chest pain, passing out, swelling in your throat or tongue, or any other symptoms concerning to  You. Please discuss with your primary care physician about prescribing a different medication for your diabetes. Stop taking your Tradjenta as it may be causing your symptoms. Hives Hives are itchy, red, puffy (swollen) areas of the skin. Hives can change in size and location on your body. Hives can come and go for hours, days, or weeks. Hives do not spread from person to person (noncontagious). Scratching, exercise, and stress can make your hives worse. HOME CARE  Avoid things that cause your hives (triggers).  Take antihistamine medicines as told by your doctor. Do not drive while taking an antihistamine.  Take any other medicines for itching as told by your doctor.  Wear loose-fitting clothing.  Keep all doctor visits as told. GET HELP RIGHT AWAY IF:   You have a fever.  Your tongue or lips are puffy.  You have trouble breathing or swallowing.  You feel tightness in the throat or chest.  You have belly (abdominal) pain.  You have lasting or severe itching that is not helped by medicine.  You have painful or puffy joints. These problems may be the first sign of a life-threatening allergic reaction. Call your local emergency services (911 in [place] MAKE SURE YOU:   Understand these instructions.  Will watch your condition.  Will get help right away if you are not doing well or get worse.   This information is not intended to replace advice given to you by your health care provider. Make sure you discuss any questions you have with your health care provider.   Document Released: 07/12/2008 Document Revised: 04/03/2012 Document Reviewed: 12/27/2011 Elsevier Interactive Patient Education [employer].

## 2015-08-24 NOTE — ED Provider Notes (Signed)
CSN: [id number]     Arrival date & time 08/24/15  1042 History   First MD Initiated Contact with Patient 08/24/15 1116     Chief Complaint  Patient presents with  . Allergic Reaction     (Consider location/radiation/quality/duration/timing/severity/associated sxs/prior Treatment) HPI 64 year old male who presents with concern for allergic reaction. History of abdominal aortic aneurysm, atrial fibrillation, history of PE/DVT on Coumadin, hypertension, and diabetes. States that he was placed on a new diabetes medication 3 days ago, and this morning woke up with pruritus involving his back and arms. Noted at 8:30 that he had diffuse hives over his torso and extremities. Subsequently called EMS. Denies any syncope, near-syncope, chest pain, difficulty breathing, swelling in his mouth or throat, nausea or vomiting, diarrhea, or abdominal discomfort. He denies any other new exposures, detergents, lotions, or foods. In route by EMS, he received Benadryl. On arrival to the ED he reports that his symptoms have fully resolved and that he otherwise feels as if he was in his usual state of health.   Past Medical History  Diagnosis Date  . AAA (abdominal aortic aneurysm) ([place])   . Arrhythmia     atrial fibrillation  . Clotting disorder ([place])     pulmonary embolism and deep vein thrombosis  . Hypertension   . Diabetes mellitus without complication [facility])    Past Surgical History  Procedure Laterality Date  . Abdominal aortic aneurysm repair     Family History  Problem Relation Age of Onset  . Heart disease Mother   . Hypertension Mother   . Heart disease Father    Social History  Substance Use Topics  . Smoking status: Never Smoker   . Smokeless tobacco: None  . Alcohol Use: None    Review of Systems 10/14 systems reviewed and are negative other than those stated in the HPI    Allergies  Review of patient's allergies indicates no known allergies.  Home Medications   Prior to  Admission medications   Medication Sig Start Date End Date Taking? Authorizing Provider  amiodarone (PACERONE) 200 MG tablet Take 200 mg by mouth daily.  08/03/15  Yes Historical Provider, MD  aspirin EC 81 MG tablet Take 81 mg by mouth daily.   Yes Historical Provider, MD  cloNIDine (CATAPRES) 0.2 MG tablet Take 0.2 mg by mouth 3 (three) times daily.   Yes Historical Provider, MD  labetalol (NORMODYNE) 200 MG tablet Take 200 mg by mouth daily.  07/10/15  Yes Historical Provider, MD  linagliptin (TRADJENTA) 5 MG TABS tablet Take 5 mg by mouth daily.   Yes Historical Provider, MD  lisinopril (PRINIVIL,ZESTRIL) 20 MG tablet Take 40 mg by mouth daily.    Yes Historical Provider, MD  simvastatin (ZOCOR) 20 MG tablet Take 20 mg by mouth daily.   Yes Historical Provider, MD  warfarin (COUMADIN) 4 MG tablet Take 4 mg by mouth daily.  07/20/15  Yes Historical Provider, MD  diphenhydrAMINE (BENADRYL) 25 MG tablet Take 2 tablets (50 mg total) by mouth every 6 (six) hours as needed for itching (hives). 08/24/15   Forde Dandy, MD   BP 162/81 mmHg  Pulse 59  Temp(Src) 98.2 F (36.8 C) (Oral)  Resp 10  SpO2 100% Physical Exam Physical Exam  Nursing note and vitals reviewed. Constitutional: Well developed, well nourished, non-toxic, and in no acute distress Head: Normocephalic and atraumatic.  Mouth/Throat: Oropharynx is clear and moist.  Neck: Normal range of motion. Neck supple.  Cardiovascular: Normal rate and  regular rhythm.   Pulmonary/Chest: Effort normal and breath sounds normal.  Abdominal: Soft. There is no tenderness. There is no rebound and no guarding.  Musculoskeletal: Normal range of motion.  Neurological: Alert, no facial droop, fluent speech, moves all extremities symmetrically Skin: Skin is warm and dry.  Psychiatric: Cooperative   ED Course  Procedures (including critical care time) Labs Review Labs Reviewed  CBC WITH DIFFERENTIAL/PLATELET - Abnormal; Notable for the following:     Hemoglobin 12.5 (*)    MCH 25.3 (*)    RDW 16.0 (*)    All other components within normal limits  BASIC METABOLIC PANEL - Abnormal; Notable for the following:    Glucose, Bld 104 (*)    BUN 21 (*)    Creatinine, Ser 1.89 (*)    Calcium 8.8 (*)    GFR calc non Af Amer 36 (*)    GFR calc Af Amer 42 (*)    All other components within normal limits  PROTIME-INR - Abnormal; Notable for the following:    Prothrombin Time 24.2 (*)    INR 2.19 (*)    All other components within normal limits    Imaging Review No results found. I have personally reviewed and evaluated these images and lab results as part of my medical decision-making.   EKG Interpretation   Date/Time:  Monday August 24 2015 10:55:42 EST Ventricular Rate:  62 PR Interval:  189 QRS Duration: 100 QT Interval:  583 QTC Calculation: 592 R Axis:   29 Text Interpretation:  Sinus rhythm Left atrial enlargement Nonspecific T  abnrm, anterolateral leads Prolonged QT interval  ----------unconfirmed---------- ST depressions  in lateral leads No prior  EKG for comparison Confirmed by Isham Smitherman MD, Lavoy Bernards ([id number]) on 08/24/2015 10:58:50  AM      MDM   Final diagnoses:  Allergic reaction, initial encounter  Hives    64 year old male who presents with hives in setting of new diabetes medication three days ago. On presentation, all symptoms resolved with benadryl by EMS. VS unremarkable. No airway or GI involvement. No concern for anaphylaxis. Basic blood work unremarkable. Discussed discontinuation of new diabetes medication. Continued benadryl for hives at home as needed. They have left message for PCP for close follow-up and initiation of different diabetes medications. Strict return and follow-up instructions reviewed. He expressed understanding of all discharge instructions and felt comfortable with the plan of care.     Forde Dandy, MD 08/24/15 [phone]

## 2015-08-24 NOTE — ED Notes (Signed)
Per pt, states he started new DM med 3 days ago

## 2015-08-24 NOTE — ED Notes (Addendum)
Per EMS-woke up this am and noticed hives to both arms and behind knees and back-states new BP meds-no respiratory distress, NSR-50 mg of benadryl given IV in route

## 2015-11-03 ENCOUNTER — Encounter: Payer: Self-pay | Admitting: Podiatry

## 2015-11-03 ENCOUNTER — Ambulatory Visit (INDEPENDENT_AMBULATORY_CARE_PROVIDER_SITE_OTHER): Payer: BLUE CROSS/BLUE SHIELD | Admitting: Podiatry

## 2015-11-03 DIAGNOSIS — M2041 Other hammer toe(s) (acquired), right foot: Secondary | ICD-10-CM | POA: Diagnosis not present

## 2015-11-03 NOTE — Progress Notes (Signed)
Patient ID: Benjamin Blair, male   DOB: 01-22-1951, 66 y.o.   MRN: [id number] Complaint:  Visit Type: Patient returns to my office for continued preventative foot care services. Complaint: Patient states" my nails have grown long and thick and become painful to walk and wear shoes" Patient has been diagnosed with DM with no foot complications. The patient presents for preventative foot care services. No changes to ROS.  He relates painful corn fifth toe left foot.  Podiatric Exam: Vascular: dorsalis pedis and posterior tibial pulses are palpable bilateral. Capillary return is immediate. Temperature gradient is WNL. Skin turgor WNL  Sensorium: Normal Semmes Weinstein monofilament test. Normal tactile sensation bilaterally. Nail Exam: Pt has thick disfigured discolored nails with subungual debris noted bilateral entire nail hallux through fifth toenails Ulcer Exam: There is no evidence of ulcer or pre-ulcerative changes or infection. Orthopedic Exam: Muscle tone and strength are WNL. No limitations in general ROM. No crepitus or effusions noted. Foot type and digits show no abnormalities. Bony prominences are unremarkable. Skin: No Porokeratosis. No infection or ulcers.  [place] durum fifth toe left foot.  Diagnosis:  Onychomycosis, , Pain in right toe, pain in left toes,  Debride HD  Treatment & Plan Procedures and Treatment: Consent by patient was obtained for treatment procedures. The patient understood the discussion of treatment and procedures well. All questions were answered thoroughly reviewed. Debridement of mycotic and hypertrophic toenails, 1 through 5 bilateral and clearing of subungual debris. No ulceration, no infection noted. Debride corn fifth left foot. Return Visit-Office Procedure: Patient instructed to return to the office for a follow up visit 3 months for continued evaluation and treatment.    Gardiner Barefoot DPM

## 2015-11-06 ENCOUNTER — Emergency Department (HOSPITAL_BASED_OUTPATIENT_CLINIC_OR_DEPARTMENT_OTHER): Payer: BLUE CROSS/BLUE SHIELD

## 2015-11-06 ENCOUNTER — Encounter (HOSPITAL_BASED_OUTPATIENT_CLINIC_OR_DEPARTMENT_OTHER): Payer: Self-pay | Admitting: Emergency Medicine

## 2015-11-06 ENCOUNTER — Emergency Department (HOSPITAL_BASED_OUTPATIENT_CLINIC_OR_DEPARTMENT_OTHER)
Admission: EM | Admit: 2015-11-06 | Discharge: 2015-11-06 | Disposition: A | Discharge status: Home / Self Care | Payer: BLUE CROSS/BLUE SHIELD | Attending: Emergency Medicine | Admitting: Emergency Medicine

## 2015-11-06 DIAGNOSIS — Z86711 Personal history of pulmonary embolism: Secondary | ICD-10-CM | POA: Diagnosis not present

## 2015-11-06 DIAGNOSIS — E119 Type 2 diabetes mellitus without complications: Secondary | ICD-10-CM | POA: Diagnosis not present

## 2015-11-06 DIAGNOSIS — Z86718 Personal history of other venous thrombosis and embolism: Secondary | ICD-10-CM | POA: Diagnosis not present

## 2015-11-06 DIAGNOSIS — Z7984 Long term (current) use of oral hypoglycemic drugs: Secondary | ICD-10-CM | POA: Diagnosis not present

## 2015-11-06 DIAGNOSIS — R011 Cardiac murmur, unspecified: Secondary | ICD-10-CM | POA: Insufficient documentation

## 2015-11-06 DIAGNOSIS — Z7982 Long term (current) use of aspirin: Secondary | ICD-10-CM | POA: Diagnosis not present

## 2015-11-06 DIAGNOSIS — R5383 Other fatigue: Secondary | ICD-10-CM | POA: Insufficient documentation

## 2015-11-06 DIAGNOSIS — R509 Fever, unspecified: Secondary | ICD-10-CM | POA: Diagnosis present

## 2015-11-06 DIAGNOSIS — Z79899 Other long term (current) drug therapy: Secondary | ICD-10-CM | POA: Diagnosis not present

## 2015-11-06 DIAGNOSIS — I10 Essential (primary) hypertension: Secondary | ICD-10-CM | POA: Insufficient documentation

## 2015-11-06 DIAGNOSIS — Z7901 Long term (current) use of anticoagulants: Secondary | ICD-10-CM | POA: Insufficient documentation

## 2015-11-06 DIAGNOSIS — R109 Unspecified abdominal pain: Secondary | ICD-10-CM | POA: Diagnosis not present

## 2015-11-06 LAB — CBC WITH DIFFERENTIAL/PLATELET
BASOS ABS: 0 10*3/uL (ref 0.0–0.1)
BASOS PCT: 0 %
EOS PCT: 1 %
Eosinophils Absolute: 0.1 10*3/uL (ref 0.0–0.7)
HCT: 39.9 % (ref 39.0–52.0)
Hemoglobin: 12.7 g/dL — ABNORMAL LOW (ref 13.0–17.0)
Lymphocytes Relative: 10 %
Lymphs Abs: 0.8 10*3/uL (ref 0.7–4.0)
MCH: 24.7 pg — ABNORMAL LOW (ref 26.0–34.0)
MCHC: 31.8 g/dL (ref 30.0–36.0)
MCV: 77.6 fL — ABNORMAL LOW (ref 78.0–100.0)
MONO ABS: 0.5 10*3/uL (ref 0.1–1.0)
Monocytes Relative: 7 %
Neutro Abs: 6.2 10*3/uL (ref 1.7–7.7)
Neutrophils Relative %: 82 %
PLATELETS: 225 10*3/uL (ref 150–400)
RBC: 5.14 MIL/uL (ref 4.22–5.81)
RDW: 17.3 % — AB (ref 11.5–15.5)
WBC: 7.6 10*3/uL (ref 4.0–10.5)

## 2015-11-06 LAB — URINE MICROSCOPIC-ADD ON: WBC, UA: NONE SEEN WBC/hpf (ref 0–5)

## 2015-11-06 LAB — URINALYSIS, ROUTINE W REFLEX MICROSCOPIC
GLUCOSE, UA: NEGATIVE mg/dL
KETONES UR: 15 mg/dL — AB
LEUKOCYTES UA: NEGATIVE
Nitrite: NEGATIVE
PROTEIN: 30 mg/dL — AB
Specific Gravity, Urine: 1.025 (ref 1.005–1.030)
pH: 5.5 (ref 5.0–8.0)

## 2015-11-06 LAB — COMPREHENSIVE METABOLIC PANEL
ALBUMIN: 3.6 g/dL (ref 3.5–5.0)
ALT: 26 U/L (ref 17–63)
AST: 25 U/L (ref 15–41)
Alkaline Phosphatase: 50 U/L (ref 38–126)
Anion gap: 7 (ref 5–15)
BUN: 25 mg/dL — AB (ref 6–20)
CHLORIDE: 104 mmol/L (ref 101–111)
CO2: 23 mmol/L (ref 22–32)
Calcium: 8.2 mg/dL — ABNORMAL LOW (ref 8.9–10.3)
Creatinine, Ser: 1.73 mg/dL — ABNORMAL HIGH (ref 0.61–1.24)
GFR calc Af Amer: 47 mL/min — ABNORMAL LOW (ref >60)
GFR calc non Af Amer: 41 mL/min — ABNORMAL LOW (ref >60)
GLUCOSE: 119 mg/dL — AB (ref 65–99)
POTASSIUM: 3.7 mmol/L (ref 3.5–5.1)
SODIUM: 134 mmol/L — AB (ref 135–145)
Total Bilirubin: 1.7 mg/dL — ABNORMAL HIGH (ref 0.3–1.2)
Total Protein: 6.8 g/dL (ref 6.5–8.1)

## 2015-11-06 LAB — CBG MONITORING, ED: GLUCOSE-CAPILLARY: 106 mg/dL — AB (ref 65–99)

## 2015-11-06 MED ORDER — SODIUM CHLORIDE 0.9 % IV BOLUS (SEPSIS)
1000.0000 mL | Freq: Once | INTRAVENOUS | Status: AC
  Administered 2015-11-06: 1000 mL via INTRAVENOUS

## 2015-11-06 MED ORDER — ACETAMINOPHEN 500 MG PO TABS
1000.0000 mg | ORAL_TABLET | Freq: Once | ORAL | Status: AC
Start: 2015-11-06 — End: 2015-11-06
  Administered 2015-11-06: 1000 mg via ORAL
  Filled 2015-11-06: qty 2

## 2015-11-06 NOTE — ED Notes (Signed)
Patient states that he woke up this am with fever chills and aches all over his body. Denies and coughing

## 2015-11-06 NOTE — ED Provider Notes (Signed)
CSN: [id number]     Arrival date & time 11/06/15  2053 History  By signing my name below, I, Rowan Blase, attest that this documentation has been prepared under the direction and in the presence of Noemi Chapel, MD . Electronically Signed: Rowan Blase, Scribe. 11/06/2015. 9:50 PM.  Chief Complaint  Patient presents with  . Fever   The history is provided by the patient. No language interpreter was used.   HPI Comments:  Benjamin Blair is a 65 y.o. male with a hx of DM, Aortic dissection, HTN, PE, and DVT who presents to the Emergency Department complaining of persistent lethargy and decreased appetite since waking this morning. While pt has not had much of an appetite he has has tolerated food and drink today. Pt reports associated fever of 101.3 measured three hours ago. Pt states he felt fine yesterday. He also reports associated mild flank pain, chills, and 1 BM with loose stool today. He states he saw his PCP yesterday after noticing blood in his semen; the PCP ordered UA and found microscopic blood in his urine but did not prescribe any antibiotics. Pt also has a PSHx of thoracic Aortic dissection repair and h/o PE and DVT following surgery ~ 1.5 years ago, and is currently on Coumadin. He also notes compliance with BP meds. Pt denies sick contacts, chest pain, pain in rectum, sinus pressure, abdominal discomfort and distention, myalgias, frequent urination, dysuria, cough, SOB, rashes, nausea, and vomiting. No alleviating factors noted.  Past Medical History  Diagnosis Date  . Aortic Dissection   . Arrhythmia     atrial fibrillation  . Clotting disorder ([place])     pulmonary embolism and deep vein thrombosis  . Hypertension   . Diabetes mellitus without complication [facility])    Past Surgical History  Procedure Laterality Date  . Abdominal aortic aneurysm repair     Family History  Problem Relation Age of Onset  . Heart disease Mother   . Hypertension Mother   . Heart disease  Father    Social History  Substance Use Topics  . Smoking status: Never Smoker   . Smokeless tobacco: None  . Alcohol Use: No    Review of Systems  Constitutional: Positive for fever, chills, appetite change and fatigue.  HENT: Negative for sinus pressure.   Respiratory: Negative for cough and shortness of breath.   Cardiovascular: Negative for chest pain.  Gastrointestinal: Negative for nausea, vomiting, abdominal pain, abdominal distention and rectal pain.  Genitourinary: Positive for flank pain. Negative for dysuria and frequency.  Musculoskeletal: Negative for myalgias.  Skin: Negative for rash.  All other systems reviewed and are negative.  Allergies  Linagliptin  Home Medications   Prior to Admission medications   Medication Sig Start Date End Date Taking? Authorizing Provider  amiodarone (PACERONE) 200 MG tablet Take 200 mg by mouth daily.  08/03/15   Historical Provider, MD  aspirin EC 81 MG tablet Take 81 mg by mouth daily.    Historical Provider, MD  cloNIDine (CATAPRES) 0.2 MG tablet Take 0.2 mg by mouth 3 (three) times daily.    Historical Provider, MD  diphenhydrAMINE (BENADRYL) 25 MG tablet Take 2 tablets (50 mg total) by mouth every 6 (six) hours as needed for itching (hives). 08/24/15   Forde Dandy, MD  labetalol (NORMODYNE) 200 MG tablet Take 200 mg by mouth daily.  07/10/15   Historical Provider, MD  linagliptin (TRADJENTA) 5 MG TABS tablet Take 5 mg by mouth daily.  Historical Provider, MD  lisinopril (PRINIVIL,ZESTRIL) 20 MG tablet Take 40 mg by mouth daily.     Historical Provider, MD  simvastatin (ZOCOR) 20 MG tablet Take 20 mg by mouth daily.    Historical Provider, MD  warfarin (COUMADIN) 4 MG tablet Take 4 mg by mouth daily.  07/20/15   Historical Provider, MD   BP 157/98 mmHg  Pulse 76  Temp(Src) 100.8 F (38.2 C) (Oral)  Resp 18  Ht '6\' 3"'$  (1.905 m)  Wt 260 lb (117.935 kg)  BMI 32.50 kg/m2  SpO2 99% Physical Exam  Constitutional: He appears  well-developed and well-nourished. No distress.  HENT:  Head: Normocephalic and atraumatic.  Mouth/Throat: Oropharynx is clear and moist. No oropharyngeal exudate.  Eyes: Conjunctivae and EOM are normal. Pupils are equal, round, and reactive to light. Right eye exhibits no discharge. Left eye exhibits no discharge. No scleral icterus.  Neck: Normal range of motion. Neck supple. No JVD present. No thyromegaly present.  Cardiovascular: Normal rate, regular rhythm and intact distal pulses.  Exam reveals no gallop and no friction rub.   Murmur heard. Pulmonary/Chest: Effort normal and breath sounds normal. No respiratory distress. He has no wheezes. He has no rales.  Abdominal: Soft. Bowel sounds are normal. He exhibits no distension and no mass. There is no tenderness.  Musculoskeletal: Normal range of motion. He exhibits no edema or tenderness.  Lymphadenopathy:    He has no cervical adenopathy.  Neurological: He is alert. Coordination normal.  Skin: Skin is warm and dry. No rash noted. No erythema.  Psychiatric: He has a normal mood and affect. His behavior is normal.  Nursing note and vitals reviewed.  ED Course  Procedures  DIAGNOSTIC STUDIES:  Oxygen Saturation is 99% on RA, normal by my interpretation.    COORDINATION OF CARE:  9:20 PM Ordered urine sample. Discussed treatment plan with pt at bedside and pt agreed to plan.  Labs Review Labs Reviewed  URINALYSIS, ROUTINE W REFLEX MICROSCOPIC (NOT AT [facility]) - Abnormal; Notable for the following:    APPearance CLOUDY (*)    Hgb urine dipstick SMALL (*)    Bilirubin Urine SMALL (*)    Ketones, ur 15 (*)    Protein, ur 30 (*)    All other components within normal limits  CBC WITH DIFFERENTIAL/PLATELET - Abnormal; Notable for the following:    Hemoglobin 12.7 (*)    MCV 77.6 (*)    MCH 24.7 (*)    RDW 17.3 (*)    All other components within normal limits  COMPREHENSIVE METABOLIC PANEL - Abnormal; Notable for the following:     Sodium 134 (*)    Glucose, Bld 119 (*)    BUN 25 (*)    Creatinine, Ser 1.73 (*)    Calcium 8.2 (*)    Total Bilirubin 1.7 (*)    GFR calc non Af Amer 41 (*)    GFR calc Af Amer 47 (*)    All other components within normal limits  URINE MICROSCOPIC-ADD ON - Abnormal; Notable for the following:    Squamous Epithelial / LPF 6-30 (*)    Bacteria, UA RARE (*)    All other components within normal limits  CBG MONITORING, ED - Abnormal; Notable for the following:    Glucose-Capillary 106 (*)    All other components within normal limits  URINE CULTURE  CBG MONITORING, ED    Imaging Review No results found. I have personally reviewed and evaluated these images and lab results  as part of my medical decision-making.   MDM   Final diagnoses:  Fever, unspecified fever cause   Meds given in ED:  Medications  acetaminophen (TYLENOL) tablet 1,000 mg (1,000 mg Oral Given 11/06/15 2143)  sodium chloride 0.9 % bolus 1,000 mL (1,000 mLs Intravenous New Bag/Given 11/06/15 2143)    New Prescriptions   No medications on file      The patient has a low-grade fever, he has no tachycardia, no other findings on exam to suggest an acute abnormality and he was totally aware of his history of the heart murmur which is not new. Doubt endocarditis, he likely has an acute minor illness, this could be early flu or respiratory infection, he is not have coughing shortness of breath or abdominal discomfort and has no rashes on his body. His evaluation included urinalysis and blood work which was unremarkable, the patient will be discharged, I have offered him further workup including chest x-ray or CT scan and he has declined, I think it is reasonable for him to follow-up in the outpatient setting, he has been able to express his understanding of the verbal discharge instructions that I gave him at the bedside.  Meds given in ED:  Medications  acetaminophen (TYLENOL) tablet 1,000 mg (1,000 mg Oral Given  11/06/15 2143)  sodium chloride 0.9 % bolus 1,000 mL (1,000 mLs Intravenous New Bag/Given 11/06/15 2143)     I personally performed the services described in this documentation, which was scribed in my presence. The recorded information has been reviewed and is accurate.        Noemi Chapel, MD 11/06/15 2308

## 2015-11-06 NOTE — ED Notes (Signed)
C/o decreased energy, decreased appetite, fever, chills onset today,  Denies cough, urinary sx or any other problems

## 2015-11-08 LAB — URINE CULTURE
Culture: 3000
Special Requests: NORMAL

## 2015-11-09 DIAGNOSIS — R319 Hematuria, unspecified: Secondary | ICD-10-CM | POA: Insufficient documentation

## 2015-11-09 DIAGNOSIS — R361 Hematospermia: Secondary | ICD-10-CM | POA: Insufficient documentation

## 2015-11-17 ENCOUNTER — Emergency Department (HOSPITAL_BASED_OUTPATIENT_CLINIC_OR_DEPARTMENT_OTHER)
Admission: EM | Admit: 2015-11-17 | Discharge: 2015-11-17 | Disposition: A | Discharge status: Home / Self Care | Payer: BLUE CROSS/BLUE SHIELD | Attending: Emergency Medicine | Admitting: Emergency Medicine

## 2015-11-17 ENCOUNTER — Encounter (HOSPITAL_BASED_OUTPATIENT_CLINIC_OR_DEPARTMENT_OTHER): Payer: Self-pay | Admitting: *Deleted

## 2015-11-17 ENCOUNTER — Emergency Department (HOSPITAL_BASED_OUTPATIENT_CLINIC_OR_DEPARTMENT_OTHER): Payer: BLUE CROSS/BLUE SHIELD

## 2015-11-17 DIAGNOSIS — Z7982 Long term (current) use of aspirin: Secondary | ICD-10-CM | POA: Diagnosis not present

## 2015-11-17 DIAGNOSIS — N183 Chronic kidney disease, stage 3 (moderate): Secondary | ICD-10-CM | POA: Diagnosis not present

## 2015-11-17 DIAGNOSIS — R011 Cardiac murmur, unspecified: Secondary | ICD-10-CM | POA: Diagnosis not present

## 2015-11-17 DIAGNOSIS — Z86711 Personal history of pulmonary embolism: Secondary | ICD-10-CM | POA: Insufficient documentation

## 2015-11-17 DIAGNOSIS — I10 Essential (primary) hypertension: Secondary | ICD-10-CM

## 2015-11-17 DIAGNOSIS — Z7901 Long term (current) use of anticoagulants: Secondary | ICD-10-CM | POA: Diagnosis not present

## 2015-11-17 DIAGNOSIS — Z79899 Other long term (current) drug therapy: Secondary | ICD-10-CM | POA: Diagnosis not present

## 2015-11-17 DIAGNOSIS — Z7984 Long term (current) use of oral hypoglycemic drugs: Secondary | ICD-10-CM | POA: Insufficient documentation

## 2015-11-17 DIAGNOSIS — R079 Chest pain, unspecified: Secondary | ICD-10-CM | POA: Diagnosis not present

## 2015-11-17 DIAGNOSIS — E119 Type 2 diabetes mellitus without complications: Secondary | ICD-10-CM | POA: Diagnosis not present

## 2015-11-17 DIAGNOSIS — Z86718 Personal history of other venous thrombosis and embolism: Secondary | ICD-10-CM | POA: Diagnosis not present

## 2015-11-17 DIAGNOSIS — I129 Hypertensive chronic kidney disease with stage 1 through stage 4 chronic kidney disease, or unspecified chronic kidney disease: Secondary | ICD-10-CM | POA: Insufficient documentation

## 2015-11-17 DIAGNOSIS — G4700 Insomnia, unspecified: Secondary | ICD-10-CM | POA: Insufficient documentation

## 2015-11-17 HISTORY — DX: Cardiac murmur, unspecified: R01.1

## 2015-11-17 HISTORY — DX: Dissection of thoracic aorta, unspecified: I71.019

## 2015-11-17 HISTORY — DX: Chronic kidney disease, stage 3 unspecified: N18.30

## 2015-11-17 HISTORY — DX: Sleep apnea, unspecified: G47.30

## 2015-11-17 HISTORY — DX: Chronic kidney disease, stage 3 (moderate): N18.3

## 2015-11-17 HISTORY — DX: Dissection of thoracic aorta: I71.01

## 2015-11-17 LAB — CBC WITH DIFFERENTIAL/PLATELET
BASOS PCT: 0 %
Basophils Absolute: 0 10*3/uL (ref 0.0–0.1)
Eosinophils Absolute: 0.2 10*3/uL (ref 0.0–0.7)
Eosinophils Relative: 4 %
HEMATOCRIT: 37.1 % — AB (ref 39.0–52.0)
HEMOGLOBIN: 11.5 g/dL — AB (ref 13.0–17.0)
LYMPHS ABS: 2.2 10*3/uL (ref 0.7–4.0)
Lymphocytes Relative: 33 %
MCH: 24 pg — AB (ref 26.0–34.0)
MCHC: 31 g/dL (ref 30.0–36.0)
MCV: 77.3 fL — ABNORMAL LOW (ref 78.0–100.0)
MONOS PCT: 10 %
Monocytes Absolute: 0.7 10*3/uL (ref 0.1–1.0)
NEUTROS ABS: 3.5 10*3/uL (ref 1.7–7.7)
Neutrophils Relative %: 53 %
Platelets: 269 10*3/uL (ref 150–400)
RBC: 4.8 MIL/uL (ref 4.22–5.81)
RDW: 16.9 % — ABNORMAL HIGH (ref 11.5–15.5)
WBC: 6.7 10*3/uL (ref 4.0–10.5)

## 2015-11-17 LAB — BASIC METABOLIC PANEL
Anion gap: 10 (ref 5–15)
BUN: 26 mg/dL — ABNORMAL HIGH (ref 6–20)
CHLORIDE: 104 mmol/L (ref 101–111)
CO2: 27 mmol/L (ref 22–32)
CREATININE: 2.16 mg/dL — AB (ref 0.61–1.24)
Calcium: 9 mg/dL (ref 8.9–10.3)
GFR calc non Af Amer: 31 mL/min — ABNORMAL LOW (ref >60)
GFR, EST AFRICAN AMERICAN: 36 mL/min — AB (ref >60)
Glucose, Bld: 125 mg/dL — ABNORMAL HIGH (ref 65–99)
Potassium: 3.8 mmol/L (ref 3.5–5.1)
Sodium: 141 mmol/L (ref 135–145)

## 2015-11-17 LAB — TROPONIN I

## 2015-11-17 LAB — PROTIME-INR
INR: 1.32 (ref 0.00–1.49)
Prothrombin Time: 16.5 seconds — ABNORMAL HIGH (ref 11.6–15.2)

## 2015-11-17 MED ORDER — AMIODARONE HCL 200 MG PO TABS
200.0000 mg | ORAL_TABLET | Freq: Every day | ORAL | Status: DC
  Filled 2015-11-17: qty 1

## 2015-11-17 MED ORDER — LABETALOL HCL 5 MG/ML IV SOLN
20.0000 mg | Freq: Once | INTRAVENOUS | Status: AC
  Administered 2015-11-17: 20 mg via INTRAVENOUS
  Filled 2015-11-17: qty 4

## 2015-11-17 MED ORDER — LISINOPRIL 10 MG PO TABS
40.0000 mg | ORAL_TABLET | Freq: Once | ORAL | Status: AC
  Administered 2015-11-17: 40 mg via ORAL
  Filled 2015-11-17: qty 4

## 2015-11-17 MED ORDER — CLONIDINE HCL 0.1 MG PO TABS
0.2000 mg | ORAL_TABLET | Freq: Once | ORAL | Status: AC
  Administered 2015-11-17: 0.2 mg via ORAL
  Filled 2015-11-17: qty 2

## 2015-11-17 MED ORDER — NITROGLYCERIN 0.4 MG SL SUBL
0.4000 mg | SUBLINGUAL_TABLET | SUBLINGUAL | Status: DC | PRN
  Administered 2015-11-17: 0.4 mg via SUBLINGUAL
  Filled 2015-11-17: qty 1

## 2015-11-17 MED ORDER — LABETALOL HCL 5 MG/ML IV SOLN
20.0000 mg | Freq: Once | INTRAVENOUS | Status: AC
Start: 2015-11-17 — End: 2015-11-17
  Administered 2015-11-17: 20 mg via INTRAVENOUS
  Filled 2015-11-17: qty 4

## 2015-11-17 NOTE — ED Provider Notes (Signed)
CSN: [id number]     Arrival date & time 11/17/15  0408 History   First MD Initiated Contact with Patient 11/17/15 [phone]     Chief Complaint  Patient presents with  . Chest Pain     (Consider location/radiation/quality/duration/timing/severity/associated sxs/prior Treatment) HPI  This is a 65 year old man with a history of diabetes, hypertension, venous thromboembolism and repair of thoracic aortic dissection. He recently had his labetalol increased to 300 milligrams 3 times a day. He was awakened about 1 AM this morning with lower abdominal pain. This resolved after about an hour. About 45 minutes prior to arrival he developed mid substernal chest pain which she describes as feeling like heartburn. This cyclically changed to a frank pain. He rates the pain a 4 out of 10. The pain is not changed with deep breathing, movement or activity. There is no associated shortness of breath, nausea, vomiting, diaphoresis, headache or visual changes. He took his blood pressure at home and found it to be 202/110. On arrival here was found to be 234/116. It was rechecked manually and found to be 210/102.   Past Medical History  Diagnosis Date  . Arrhythmia     atrial fibrillation  . Clotting disorder ([place])     pulmonary embolism and deep vein thrombosis  . Hypertension   . Diabetes mellitus without complication ([place])   . Heart murmur   . Thoracic aortic dissection ([place])   . Chronic renal insufficiency, stage III (moderate)   . Sleep apnea     wears C-PAP at night   Past Surgical History  Procedure Laterality Date  . Repair of acute ascending thoracic aortic dissection     Family History  Problem Relation Age of Onset  . Heart disease Mother   . Hypertension Mother   . Heart disease Father    Social History  Substance Use Topics  . Smoking status: Never Smoker   . Smokeless tobacco: None  . Alcohol Use: No    Review of Systems  All other systems reviewed and are negative.   Allergies   Linagliptin  Home Medications   Prior to Admission medications   Medication Sig Start Date End Date Taking? Authorizing Provider  amiodarone (PACERONE) 200 MG tablet Take 200 mg by mouth daily.  08/03/15   Historical Provider, MD  aspirin EC 81 MG tablet Take 81 mg by mouth daily.    Historical Provider, MD  cloNIDine (CATAPRES) 0.2 MG tablet Take 0.2 mg by mouth 3 (three) times daily.    Historical Provider, MD  diphenhydrAMINE (BENADRYL) 25 MG tablet Take 2 tablets (50 mg total) by mouth every 6 (six) hours as needed for itching (hives). 08/24/15   Forde Dandy, MD  labetalol (NORMODYNE) 200 MG tablet Take 200 mg by mouth daily.  07/10/15   Historical Provider, MD  linagliptin (TRADJENTA) 5 MG TABS tablet Take 5 mg by mouth daily.    Historical Provider, MD  lisinopril (PRINIVIL,ZESTRIL) 20 MG tablet Take 40 mg by mouth daily.     Historical Provider, MD  simvastatin (ZOCOR) 20 MG tablet Take 20 mg by mouth daily.    Historical Provider, MD  warfarin (COUMADIN) 4 MG tablet Take 4 mg by mouth daily.  07/20/15   Historical Provider, MD   BP 195/102 mmHg  Pulse 58  Temp(Src) 97.7 F (36.5 C) (Oral)  Resp 16  Ht '6\' 3"'$  (1.905 m)  Wt 260 lb (117.935 kg)  BMI 32.50 kg/m2  SpO2 97%   Physical Exam  General: Well-developed, well-nourished male in no acute distress; appearance consistent with age of record HENT: normocephalic; atraumatic Eyes: pupils equal, round and reactive to light; extraocular muscles intact; arcus senilis bilaterally Neck: supple Heart: regular rate and rhythm; diastolic murmur at the right upper sternal border Lungs: clear to auscultation bilaterally Abdomen: soft; nondistended; nontender; no masses or hepatosplenomegaly; bowel sounds present Extremities: No deformity; full range of motion; pulses normal Neurologic: Awake, alert and oriented; motor function intact in all extremities and symmetric; no facial droop Skin: Warm and dry Psychiatric: Normal mood and  affect    ED Course  Procedures (including critical care time)   EKG Interpretation   Date/Time:  Tuesday November 17 2015 04:21:28 EST Ventricular Rate:  66 PR Interval:  200 QRS Duration: 102 QT Interval:  468 QTC Calculation: 490 R Axis:   5 Text Interpretation:  Normal sinus rhythm Possible Left atrial enlargement  Prolonged QT Abnormal ECG No significant change was found Confirmed by  Florina Ou  MD, Jenny Reichmann ([id number]) on 11/17/2015 4:26:10 AM      MDM  Nursing notes and vitals signs, including pulse oximetry, reviewed.  Summary of this visit's results, reviewed by myself:  Labs:  Results for orders placed or performed during the hospital encounter of 11/17/15 (from the past 24 hour(s))  CBC with Differential/Platelet     Status: Abnormal   Collection Time: 11/17/15  4:50 AM  Result Value Ref Range   WBC 6.7 4.0 - 10.5 K/uL   RBC 4.80 4.22 - 5.81 MIL/uL   Hemoglobin 11.5 (L) 13.0 - 17.0 g/dL   HCT 37.1 (L) 39.0 - 52.0 %   MCV 77.3 (L) 78.0 - 100.0 fL   MCH 24.0 (L) 26.0 - 34.0 pg   MCHC 31.0 30.0 - 36.0 g/dL   RDW 16.9 (H) 11.5 - 15.5 %   Platelets 269 150 - 400 K/uL   Neutrophils Relative % 53 %   Neutro Abs 3.5 1.7 - 7.7 K/uL   Lymphocytes Relative 33 %   Lymphs Abs 2.2 0.7 - 4.0 K/uL   Monocytes Relative 10 %   Monocytes Absolute 0.7 0.1 - 1.0 K/uL   Eosinophils Relative 4 %   Eosinophils Absolute 0.2 0.0 - 0.7 K/uL   Basophils Relative 0 %   Basophils Absolute 0.0 0.0 - 0.1 K/uL  Basic metabolic panel     Status: Abnormal   Collection Time: 11/17/15  4:50 AM  Result Value Ref Range   Sodium 141 135 - 145 mmol/L   Potassium 3.8 3.5 - 5.1 mmol/L   Chloride 104 101 - 111 mmol/L   CO2 27 22 - 32 mmol/L   Glucose, Bld 125 (H) 65 - 99 mg/dL   BUN 26 (H) 6 - 20 mg/dL   Creatinine, Ser 2.16 (H) 0.61 - 1.24 mg/dL   Calcium 9.0 8.9 - 10.3 mg/dL   GFR calc non Af Amer 31 (L) >60 mL/min   GFR calc Af Amer 36 (L) >60 mL/min   Anion gap 10 5 - 15  Protime-INR      Status: Abnormal   Collection Time: 11/17/15  4:50 AM  Result Value Ref Range   Prothrombin Time 16.5 (H) 11.6 - 15.2 seconds   INR 1.32 0.00 - 1.49  Troponin I     Status: None   Collection Time: 11/17/15  4:50 AM  Result Value Ref Range   Troponin I <0.03 <0.031 ng/mL  Troponin I     Status: None   Collection Time:  11/17/15  7:53 AM  Result Value Ref Range   Troponin I <0.03 <0.031 ng/mL    Imaging Studies: Dg Chest 2 View  11/17/2015  CLINICAL DATA:  Initial evaluation for acute chest pain. EXAM: CHEST  2 VIEW COMPARISON:  None available. FINDINGS: Median sternotomy wires noted. Moderate cardiomegaly with left atrial enlargement. Mediastinal silhouette within normal limits. Tortuosity the intrathoracic aorta noted. Lungs are normally inflated. Patchy and linear left basilar opacity, atelectasis or small focal infiltrate. No other focal airspace disease. Minimal right basilar atelectasis. No pneumothorax. No pulmonary edema or pleural effusion. IMPRESSION: 1. Patchy left basilar opacity, which may reflect atelectasis or infiltrate. 2. No other active cardiopulmonary disease. 3. Cardiomegaly without pulmonary edema. Electronically Signed   By: Jeannine Boga M.D.   On: 11/17/2015 05:20   5:31 AM Chest pain coming and going, currently a 1 out of 10. Blood pressure mildly improved with 20 milligrams of labetalol IV.  5:52 AM Chest pain now is 0 out of 10 after nitroglycerin sublingual 1. He does still have some discomfort when swallowing.   6:50 AM Still having mild pain only when he swallows. BP is 167/101. Repeat troponin due at 8 AM; Dr. Rogene Houston to follow up and make disposition.     Shanon Rosser, MD 11/17/15 2242

## 2015-11-17 NOTE — ED Provider Notes (Signed)
Results for orders placed or performed during the hospital encounter of 11/17/15  CBC with Differential/Platelet  Result Value Ref Range   WBC 6.7 4.0 - 10.5 K/uL   RBC 4.80 4.22 - 5.81 MIL/uL   Hemoglobin 11.5 (L) 13.0 - 17.0 g/dL   HCT 37.1 (L) 39.0 - 52.0 %   MCV 77.3 (L) 78.0 - 100.0 fL   MCH 24.0 (L) 26.0 - 34.0 pg   MCHC 31.0 30.0 - 36.0 g/dL   RDW 16.9 (H) 11.5 - 15.5 %   Platelets 269 150 - 400 K/uL   Neutrophils Relative % 53 %   Neutro Abs 3.5 1.7 - 7.7 K/uL   Lymphocytes Relative 33 %   Lymphs Abs 2.2 0.7 - 4.0 K/uL   Monocytes Relative 10 %   Monocytes Absolute 0.7 0.1 - 1.0 K/uL   Eosinophils Relative 4 %   Eosinophils Absolute 0.2 0.0 - 0.7 K/uL   Basophils Relative 0 %   Basophils Absolute 0.0 0.0 - 0.1 K/uL  Basic metabolic panel  Result Value Ref Range   Sodium 141 135 - 145 mmol/L   Potassium 3.8 3.5 - 5.1 mmol/L   Chloride 104 101 - 111 mmol/L   CO2 27 22 - 32 mmol/L   Glucose, Bld 125 (H) 65 - 99 mg/dL   BUN 26 (H) 6 - 20 mg/dL   Creatinine, Ser 2.16 (H) 0.61 - 1.24 mg/dL   Calcium 9.0 8.9 - 10.3 mg/dL   GFR calc non Af Amer 31 (L) >60 mL/min   GFR calc Af Amer 36 (L) >60 mL/min   Anion gap 10 5 - 15  Protime-INR  Result Value Ref Range   Prothrombin Time 16.5 (H) 11.6 - 15.2 seconds   INR 1.32 0.00 - 1.49  Troponin I  Result Value Ref Range   Troponin I <0.03 <0.031 ng/mL  Troponin I  Result Value Ref Range   Troponin I <0.03 <0.031 ng/mL   Dg Chest 2 View  11/17/2015  CLINICAL DATA:  Initial evaluation for acute chest pain. EXAM: CHEST  2 VIEW COMPARISON:  None available. FINDINGS: Median sternotomy wires noted. Moderate cardiomegaly with left atrial enlargement. Mediastinal silhouette within normal limits. Tortuosity the intrathoracic aorta noted. Lungs are normally inflated. Patchy and linear left basilar opacity, atelectasis or small focal infiltrate. No other focal airspace disease. Minimal right basilar atelectasis. No pneumothorax. No  pulmonary edema or pleural effusion. IMPRESSION: 1. Patchy left basilar opacity, which may reflect atelectasis or infiltrate. 2. No other active cardiopulmonary disease. 3. Cardiomegaly without pulmonary edema. Electronically Signed   By: Jeannine Boga M.D.   On: 11/17/2015 05:20    Patient's repeat troponin negative. Patient without recurrent chest pain. Only has some discomfort when he swallows. Do not feel that this is an acute cardiac event. However his blood pressures been a different story where it's been challenging to control. Patient arrived with pressures to 34/116. Currently we've got 188/99. The lowest we received with the labetalol treatment was 166/105. Patient without evidence of trouble breathing, headache, or strokelike symptoms. Patient does not want to be admitted he is refusing admission. Patient followed by cardiologists and Adrian Blackwater. Patient was given his doses of antihypertensive meds here would expect further improvement in the blood pressure. Patient will need to resume them at home. Patient was not given his oral dose of labetalol but he was given IV labetalol 2.  Patient will follow-up with his cardiologist he will return for any new or worse symptoms.  Fredia Sorrow, MD 11/17/15 1001

## 2015-11-17 NOTE — ED Notes (Signed)
Pt denies pain at rest but has small amount of pain with swallowing.

## 2015-11-17 NOTE — ED Notes (Signed)
Amiodarone tablet not available. MD aware.

## 2015-11-17 NOTE — ED Notes (Signed)
MD aware of  BP at discharge. Confirmed with MD that pt should take his meds as usual the rest of day. Morning meds minus amiodarone were given in ED.

## 2015-11-17 NOTE — ED Notes (Signed)
Pt reports awaken from sleep by chest discomfort took BP at that time and states was 202/110 on repeat

## 2015-11-17 NOTE — ED Notes (Signed)
Continues to report pain with swallowing only 1-2/ 10 scale

## 2015-11-17 NOTE — Discharge Instructions (Signed)
Workup for the chest pain without any acute findings. Cardiac markers negative 2. Blood pressure has been challenging to control but showing signs of improvement. Continue your regular regimen of blood pressure medicine and she get home. Make an appointment to follow-up with your cardiologist as soon as possible for blood pressure recheck. Return for any worse chest pain headache or stroke symptoms or trouble breathing. As we discussed you did meet criteria for admission but you do not want to be admitted so we'll try close outpatient follow-up for the blood pressure control.

## 2015-12-28 DIAGNOSIS — C3490 Malignant neoplasm of unspecified part of unspecified bronchus or lung: Secondary | ICD-10-CM | POA: Insufficient documentation

## 2016-01-14 DIAGNOSIS — K8012 Calculus of gallbladder with acute and chronic cholecystitis without obstruction: Secondary | ICD-10-CM | POA: Insufficient documentation

## 2016-02-05 ENCOUNTER — Encounter: Payer: Self-pay | Admitting: Podiatry

## 2016-02-05 ENCOUNTER — Ambulatory Visit (INDEPENDENT_AMBULATORY_CARE_PROVIDER_SITE_OTHER): Payer: BLUE CROSS/BLUE SHIELD | Admitting: Podiatry

## 2016-02-05 DIAGNOSIS — B351 Tinea unguium: Secondary | ICD-10-CM

## 2016-02-05 DIAGNOSIS — M79676 Pain in unspecified toe(s): Secondary | ICD-10-CM

## 2016-02-05 DIAGNOSIS — M2041 Other hammer toe(s) (acquired), right foot: Secondary | ICD-10-CM | POA: Diagnosis not present

## 2016-02-05 NOTE — Progress Notes (Signed)
Patient ID: Benjamin Blair, male   DOB: Apr 28, 1951, 65 y.o.   MRN: [id number] Complaint:  Visit Type: Patient returns to my office for continued preventative foot care services. Complaint: Patient states" my nails have grown long and thick and become painful to walk and wear shoes" Patient has been diagnosed with DM with no foot complications. The patient presents for preventative foot care services. No changes to ROS.  He relates painful corn fifth toe left foot.  Podiatric Exam: Vascular: dorsalis pedis and posterior tibial pulses are palpable bilateral. Capillary return is immediate. Temperature gradient is WNL. Skin turgor WNL  Sensorium: Normal Semmes Weinstein monofilament test. Normal tactile sensation bilaterally. Nail Exam: Pt has thick disfigured discolored nails with subungual debris noted bilateral entire nail hallux through fifth toenails Ulcer Exam: There is no evidence of ulcer or pre-ulcerative changes or infection. Orthopedic Exam: Muscle tone and strength are WNL. No limitations in general ROM. No crepitus or effusions noted. Foot type and digits show no abnormalities. Bony prominences are unremarkable. Skin: No Porokeratosis. No infection or ulcers.  [place] durum fifth toe left foot.  Diagnosis:  Onychomycosis, , Pain in right toe, pain in left toes,  Debride HD  Treatment & Plan Procedures and Treatment: Consent by patient was obtained for treatment procedures. The patient understood the discussion of treatment and procedures well. All questions were answered thoroughly reviewed. Debridement of mycotic and hypertrophic toenails, 1 through 5 bilateral and clearing of subungual debris. No ulceration, no infection noted. Debride corn fifth left foot. Return Visit-Office Procedure: Patient instructed to return to the office for a follow up visit 3 months for continued evaluation and treatment.    Gardiner Barefoot DPM

## 2016-02-10 DIAGNOSIS — R809 Proteinuria, unspecified: Secondary | ICD-10-CM | POA: Insufficient documentation

## 2016-02-10 DIAGNOSIS — N183 Chronic kidney disease, stage 3 unspecified: Secondary | ICD-10-CM | POA: Insufficient documentation

## 2016-03-11 IMAGING — DX DG CHEST 2V
2 series · 2 of 2 positions shown · non-contrast
Comparison: None available.

CLINICAL DATA: Initial evaluation for acute chest pain.

EXAM:
CHEST  2 VIEW

[chest pa]
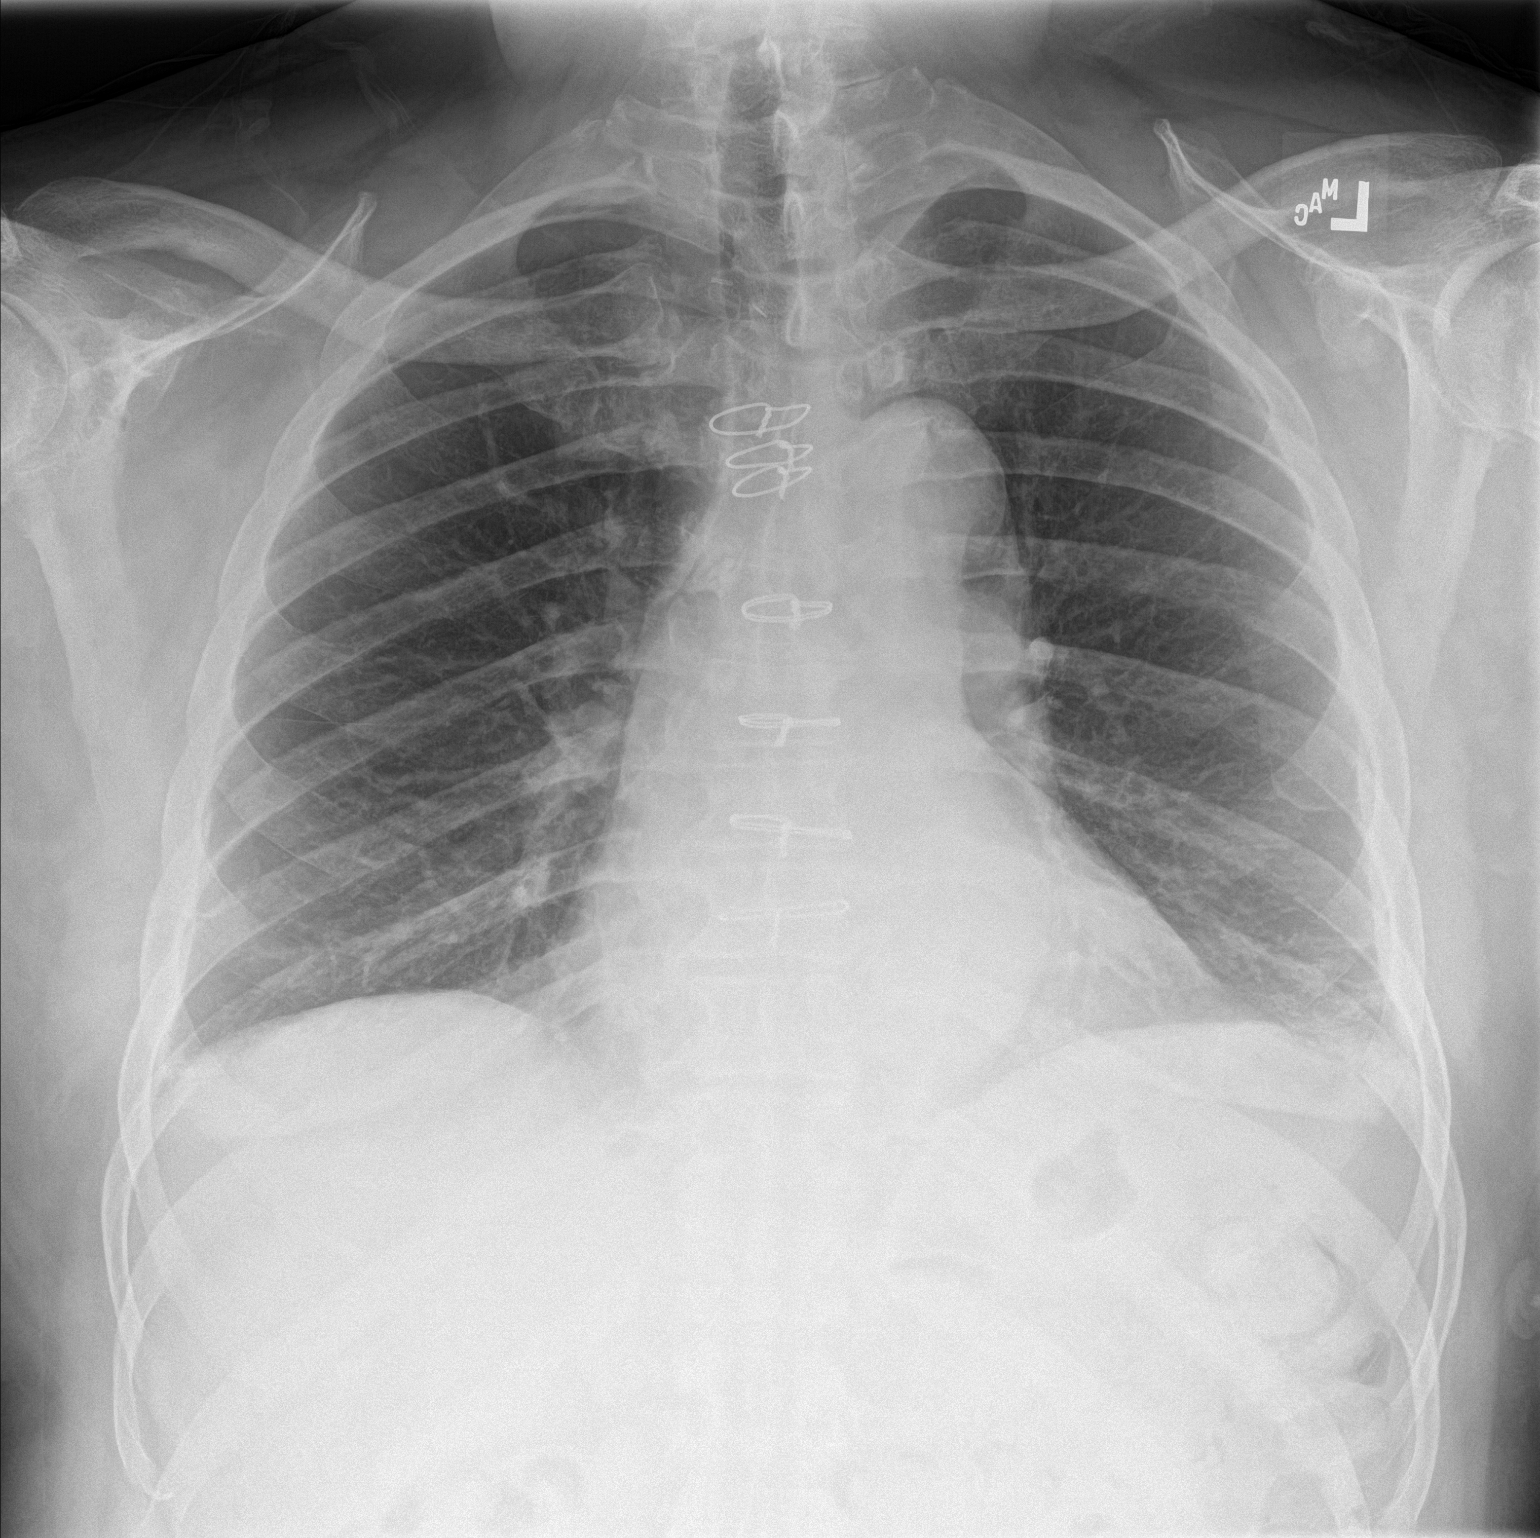

[chest lat]
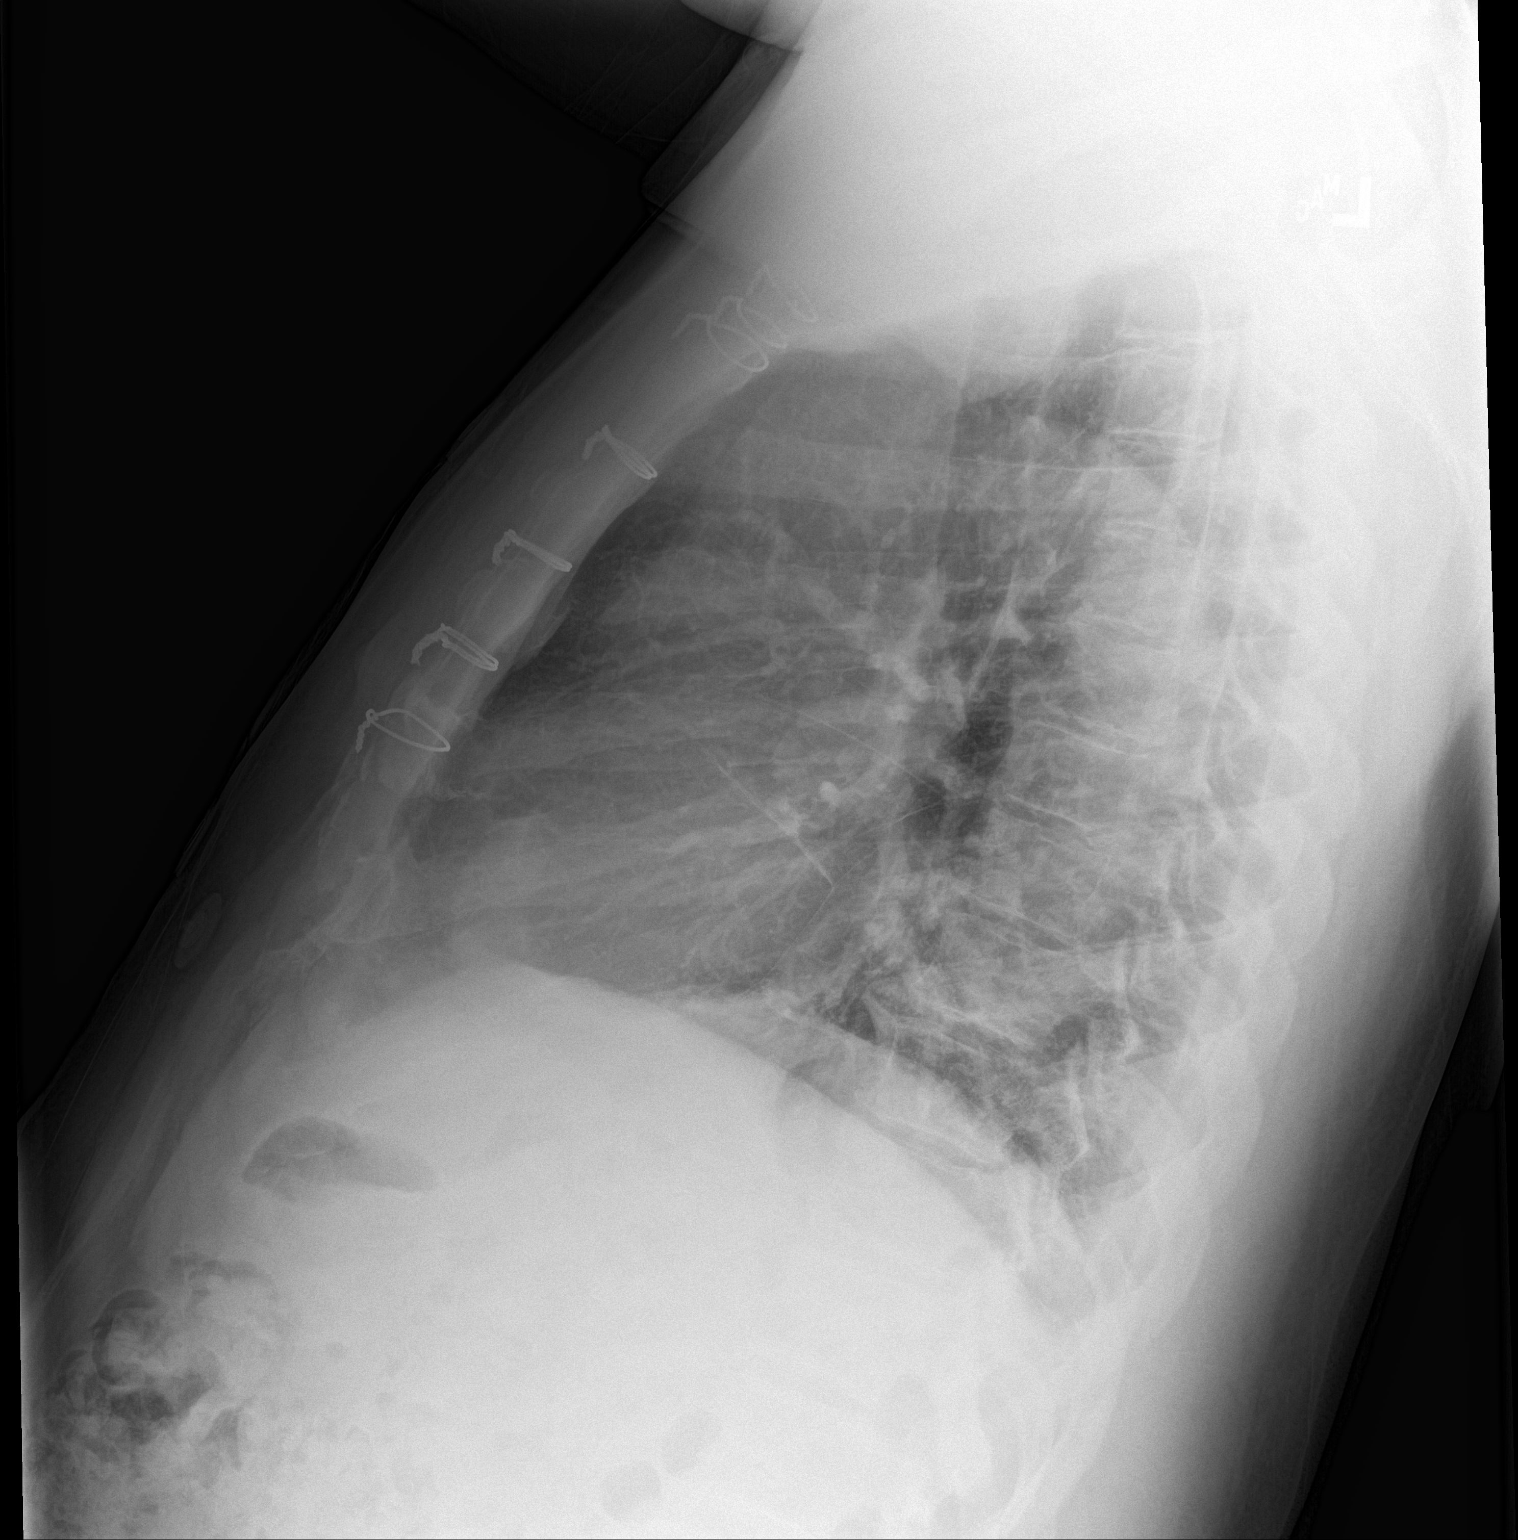

[2 of 2 positions shown; findings below may reference images not displayed]

FINDINGS: Median sternotomy wires noted. Moderate cardiomegaly with left
atrial enlargement. Mediastinal silhouette within normal limits.
Tortuosity the intrathoracic aorta noted.

Lungs are normally inflated. Patchy and linear left basilar opacity,
atelectasis or small focal infiltrate. No other focal airspace
disease. Minimal right basilar atelectasis. No pneumothorax. No
pulmonary edema or pleural effusion.
IMPRESSION: 1. Patchy left basilar opacity, which may reflect atelectasis or
infiltrate.
2. No other active cardiopulmonary disease.
3. Cardiomegaly without pulmonary edema.

## 2016-05-06 ENCOUNTER — Ambulatory Visit: Payer: BLUE CROSS/BLUE SHIELD | Admitting: Podiatry

## 2016-05-24 ENCOUNTER — Ambulatory Visit: Payer: BLUE CROSS/BLUE SHIELD | Admitting: Podiatry

## 2016-06-14 ENCOUNTER — Ambulatory Visit: Payer: BLUE CROSS/BLUE SHIELD | Admitting: Podiatry

## 2016-12-02 ENCOUNTER — Ambulatory Visit: Payer: BLUE CROSS/BLUE SHIELD | Admitting: Podiatry

## 2016-12-09 ENCOUNTER — Ambulatory Visit (INDEPENDENT_AMBULATORY_CARE_PROVIDER_SITE_OTHER): Payer: BLUE CROSS/BLUE SHIELD | Admitting: Podiatry

## 2016-12-09 ENCOUNTER — Encounter: Payer: Self-pay | Admitting: Podiatry

## 2016-12-09 DIAGNOSIS — B351 Tinea unguium: Secondary | ICD-10-CM

## 2016-12-09 DIAGNOSIS — M79676 Pain in unspecified toe(s): Secondary | ICD-10-CM

## 2016-12-09 DIAGNOSIS — L84 Corns and callosities: Secondary | ICD-10-CM

## 2016-12-09 DIAGNOSIS — E1149 Type 2 diabetes mellitus with other diabetic neurological complication: Secondary | ICD-10-CM | POA: Diagnosis not present

## 2016-12-09 NOTE — Progress Notes (Signed)
Patient ID: Benjamin Blair, male   DOB: 10/02/1951, 65 y.o.   MRN: [id number] Complaint:  Visit Type: Patient returns to my office for continued preventative foot care services. Complaint: Patient states" my nails have grown long and thick and become painful to walk and wear shoes" Patient has been diagnosed with DM with no foot complications. The patient presents for preventative foot care services. No changes to ROS.  He relates painful corn fifth toe left foot.  Podiatric Exam: Vascular: dorsalis pedis and posterior tibial pulses are palpable bilateral. Capillary return is immediate. Temperature gradient is WNL. Skin turgor WNL  Sensorium: Diminished  Semmes Weinstein monofilament test. Normal tactile sensation bilaterally. Nail Exam: Pt has thick disfigured discolored nails with subungual debris noted bilateral entire nail hallux through fifth toenails.  Absence nail plate hallux  B/L Ulcer Exam: There is no evidence of ulcer or pre-ulcerative changes or infection. Orthopedic Exam: Muscle tone and strength are WNL. No limitations in general ROM. No crepitus or effusions noted. Foot type and digits show no abnormalities. Bony prominences are unremarkable. Skin: No Porokeratosis. No infection or ulcers.  Heloma durum fifth toe left foot.  Diagnosis:  Onychomycosis, , Pain in right toe, pain in left toes,  Debride HD  Treatment & Plan Procedures and Treatment: Consent by patient was obtained for treatment procedures. The patient understood the discussion of treatment and procedures well. All questions were answered thoroughly reviewed. Debridement of mycotic and hypertrophic toenails, 1 through 5 bilateral and clearing of subungual debris. No ulceration, no infection noted. Debride corn fifth left foot. Return Visit-Office Procedure: Patient instructed to return to the office for a follow up visit 3 months for continued evaluation and treatment.    [name] DPM 

## 2017-03-08 ENCOUNTER — Ambulatory Visit (INDEPENDENT_AMBULATORY_CARE_PROVIDER_SITE_OTHER): Payer: BLUE CROSS/BLUE SHIELD | Admitting: Podiatry

## 2017-03-08 ENCOUNTER — Encounter: Payer: Self-pay | Admitting: Podiatry

## 2017-03-08 DIAGNOSIS — M79676 Pain in unspecified toe(s): Secondary | ICD-10-CM | POA: Diagnosis not present

## 2017-03-08 DIAGNOSIS — B351 Tinea unguium: Secondary | ICD-10-CM | POA: Diagnosis not present

## 2017-03-08 DIAGNOSIS — L84 Corns and callosities: Secondary | ICD-10-CM

## 2017-03-08 NOTE — Progress Notes (Signed)
Patient ID: Benjamin Blair, male   DOB: May 24, 1951, 66 y.o.   MRN: [id number] Complaint:  Visit Type: Patient returns to my office for continued preventative foot care services. Complaint: Patient states" my nails have grown long and thick and become painful to walk and wear shoes" Patient has been diagnosed with DM with no foot complications. The patient presents for preventative foot care services. No changes to ROS.  He relates painful corn fifth toe left foot.  Podiatric Exam: Vascular: dorsalis pedis and posterior tibial pulses are palpable bilateral. Capillary return is immediate. Temperature gradient is WNL. Skin turgor WNL  Sensorium: Diminished  Semmes Weinstein monofilament test. Normal tactile sensation bilaterally. Nail Exam: Pt has thick disfigured discolored nails with subungual debris noted bilateral entire nail hallux through fifth toenails.  Absence nail plate hallux  B/L Ulcer Exam: There is no evidence of ulcer or pre-ulcerative changes or infection. Orthopedic Exam: Muscle tone and strength are WNL. No limitations in general ROM. No crepitus or effusions noted. Foot type and digits show no abnormalities. Bony prominences are unremarkable. Skin: No Porokeratosis. No infection or ulcers.  Heloma durum fifth toe left foot.  Diagnosis:  Onychomycosis, , Pain in right toe, pain in left toes,  Debride HD  Treatment & Plan Procedures and Treatment: Consent by patient was obtained for treatment procedures. The patient understood the discussion of treatment and procedures well. All questions were answered thoroughly reviewed. Debridement of mycotic and hypertrophic toenails, 1 through 5 bilateral and clearing of subungual debris. No ulceration, no infection noted. Debride corn fifth left foot. Return Visit-Office Procedure: Patient instructed to return to the office for a follow up visit 3 months for continued evaluation and treatment.    Gardiner Barefoot DPM

## 2017-06-07 ENCOUNTER — Ambulatory Visit: Payer: BLUE CROSS/BLUE SHIELD | Admitting: Podiatry

## 2017-07-05 ENCOUNTER — Encounter: Payer: Self-pay | Admitting: Podiatry

## 2017-07-05 ENCOUNTER — Ambulatory Visit (INDEPENDENT_AMBULATORY_CARE_PROVIDER_SITE_OTHER): Payer: BLUE CROSS/BLUE SHIELD | Admitting: Podiatry

## 2017-07-05 DIAGNOSIS — M79676 Pain in unspecified toe(s): Secondary | ICD-10-CM | POA: Diagnosis not present

## 2017-07-05 DIAGNOSIS — B351 Tinea unguium: Secondary | ICD-10-CM

## 2017-07-05 DIAGNOSIS — L84 Corns and callosities: Secondary | ICD-10-CM | POA: Diagnosis not present

## 2017-07-05 DIAGNOSIS — E1149 Type 2 diabetes mellitus with other diabetic neurological complication: Secondary | ICD-10-CM | POA: Diagnosis not present

## 2017-07-05 NOTE — Progress Notes (Signed)
Patient ID: Benjamin Blair, male   DOB: Jul 25, 1951, 66 y.o.   MRN: [id number] Complaint:  Visit Type: Patient returns to my office for continued preventative foot care services. Complaint: Patient states" my nails have grown long and thick and become painful to walk and wear shoes" Patient has been diagnosed with DM with no foot complications. The patient presents for preventative foot care services. No changes to ROS.  He relates painful corn fifth toe left foot.  Podiatric Exam: Vascular: dorsalis pedis and posterior tibial pulses are palpable bilateral. Capillary return is immediate. Temperature gradient is WNL. Skin turgor WNL  Sensorium: Diminished  Semmes Weinstein monofilament test. Normal tactile sensation bilaterally. Nail Exam: Pt has thick disfigured discolored nails with subungual debris noted bilateral entire nail hallux through fifth toenails.  Absence nail plate hallux  B/L Ulcer Exam: There is no evidence of ulcer or pre-ulcerative changes or infection. Orthopedic Exam: Muscle tone and strength are WNL. No limitations in general ROM. No crepitus or effusions noted. Foot type and digits show no abnormalities. Bony prominences are unremarkable. Skin: No Porokeratosis. No infection or ulcers.  Heloma durum fifth toe left foot.  Diagnosis:  Onychomycosis, , Pain in right toe, pain in left toes,  Debride HD  Treatment & Plan Procedures and Treatment: Consent by patient was obtained for treatment procedures. The patient understood the discussion of treatment and procedures well. All questions were answered thoroughly reviewed. Debridement of mycotic and hypertrophic toenails, 1 through 5 bilateral and clearing of subungual debris. No ulceration, no infection noted. Debride corn fifth left foot. Return Visit-Office Procedure: Patient instructed to return to the office for a follow up visit 3 months for continued evaluation and treatment.    Gardiner Barefoot DPM

## 2017-07-13 DIAGNOSIS — H43393 Other vitreous opacities, bilateral: Secondary | ICD-10-CM | POA: Insufficient documentation

## 2017-07-13 DIAGNOSIS — H5203 Hypermetropia, bilateral: Secondary | ICD-10-CM | POA: Insufficient documentation

## 2017-07-13 DIAGNOSIS — Z7984 Long term (current) use of oral hypoglycemic drugs: Secondary | ICD-10-CM | POA: Insufficient documentation

## 2017-07-13 DIAGNOSIS — H2513 Age-related nuclear cataract, bilateral: Secondary | ICD-10-CM | POA: Insufficient documentation

## 2017-07-13 DIAGNOSIS — H40003 Preglaucoma, unspecified, bilateral: Secondary | ICD-10-CM | POA: Insufficient documentation

## 2017-07-13 DIAGNOSIS — H52203 Unspecified astigmatism, bilateral: Secondary | ICD-10-CM

## 2017-07-13 DIAGNOSIS — H524 Presbyopia: Secondary | ICD-10-CM | POA: Insufficient documentation

## 2017-10-04 ENCOUNTER — Ambulatory Visit: Payer: BLUE CROSS/BLUE SHIELD | Admitting: Podiatry

## 2018-02-01 DIAGNOSIS — K56600 Partial intestinal obstruction, unspecified as to cause: Secondary | ICD-10-CM | POA: Insufficient documentation

## 2018-02-03 DIAGNOSIS — E119 Type 2 diabetes mellitus without complications: Secondary | ICD-10-CM | POA: Insufficient documentation

## 2018-02-03 DIAGNOSIS — Z9889 Other specified postprocedural states: Secondary | ICD-10-CM | POA: Insufficient documentation

## 2018-02-03 DIAGNOSIS — Z8679 Personal history of other diseases of the circulatory system: Secondary | ICD-10-CM

## 2018-02-09 ENCOUNTER — Encounter: Payer: Self-pay | Admitting: Podiatry

## 2018-02-09 ENCOUNTER — Ambulatory Visit: Payer: BLUE CROSS/BLUE SHIELD | Admitting: Podiatry

## 2018-02-09 DIAGNOSIS — L84 Corns and callosities: Secondary | ICD-10-CM

## 2018-02-09 DIAGNOSIS — E1149 Type 2 diabetes mellitus with other diabetic neurological complication: Secondary | ICD-10-CM

## 2018-02-09 DIAGNOSIS — B351 Tinea unguium: Secondary | ICD-10-CM | POA: Diagnosis not present

## 2018-02-09 DIAGNOSIS — M79676 Pain in unspecified toe(s): Secondary | ICD-10-CM

## 2018-02-09 NOTE — Progress Notes (Signed)
Patient ID: Benjamin Blair, male   DOB: Nov 26, 1950, 67 y.o.   MRN: [id number] Complaint:  Visit Type: Patient returns to my office for continued preventative foot care services. Complaint: Patient states" my nails have grown long and thick and become painful to walk and wear shoes" Patient has been diagnosed with DM with no foot complications. The patient presents for preventative foot care services. No changes to ROS.  He relates painful corn fifth toe left foot.  Podiatric Exam: Vascular: dorsalis pedis and posterior tibial pulses are palpable bilateral. Capillary return is immediate. Temperature gradient is WNL. Skin turgor WNL  Sensorium: Diminished  Semmes Weinstein monofilament test. Normal tactile sensation bilaterally. Nail Exam: Pt has thick disfigured discolored nails with subungual debris noted bilateral entire nail second  through fifth toenails.  Absence nail plate hallux  B/LNail spicule right hallux. Ulcer Exam: There is no evidence of ulcer or pre-ulcerative changes or infection. Orthopedic Exam: Muscle tone and strength are WNL. No limitations in general ROM. No crepitus or effusions noted. Foot type and digits show no abnormalities. Bony prominences are unremarkable. Skin: No Porokeratosis. No infection or ulcers.  Heloma durum fifth toe left foot.  Diagnosis:  Onychomycosis, , Pain in right toe, pain in left toes,  Debride HD  Treatment & Plan Procedures and Treatment: Consent by patient was obtained for treatment procedures. The patient understood the discussion of treatment and procedures well. All questions were answered thoroughly reviewed. Debridement of mycotic and hypertrophic toenails, 1 through 5 bilateral and clearing of subungual debris. No ulceration, no infection noted. Debride corn fifth left foot. Return Visit-Office Procedure: Patient instructed to return to the office for a follow up visit 3 months for continued evaluation and treatment.    Gardiner Barefoot DPM

## 2018-05-09 ENCOUNTER — Encounter: Payer: Self-pay | Admitting: Podiatry

## 2018-05-09 ENCOUNTER — Ambulatory Visit (INDEPENDENT_AMBULATORY_CARE_PROVIDER_SITE_OTHER): Payer: Medicare Other | Admitting: Podiatry

## 2018-05-09 DIAGNOSIS — B351 Tinea unguium: Secondary | ICD-10-CM

## 2018-05-09 DIAGNOSIS — M79676 Pain in unspecified toe(s): Secondary | ICD-10-CM | POA: Diagnosis not present

## 2018-05-09 DIAGNOSIS — L84 Corns and callosities: Secondary | ICD-10-CM | POA: Diagnosis not present

## 2018-05-09 DIAGNOSIS — E1149 Type 2 diabetes mellitus with other diabetic neurological complication: Secondary | ICD-10-CM

## 2018-05-09 NOTE — Progress Notes (Signed)
Patient ID: Benjamin Blair, male   DOB: 10/19/1950, 67 y.o.   MRN: [id number] Complaint:  Visit Type: Patient returns to my office for continued preventative foot care services. Complaint: Patient states" my nails have grown long and thick and become painful to walk and wear shoes" Patient has been diagnosed with DM with no foot complications. The patient presents for preventative foot care services. No changes to ROS.  He relates painful corn fifth toe left foot.  Podiatric Exam: Vascular: dorsalis pedis and posterior tibial pulses are palpable bilateral. Capillary return is immediate. Temperature gradient is WNL. Skin turgor WNL  Sensorium: Diminished  Semmes Weinstein monofilament test. Normal tactile sensation bilaterally. Nail Exam: Pt has thick disfigured discolored nails with subungual debris noted bilateral entire nail second  through fifth toenails.  Absence nail plate hallux  B/LNail spicule right hallux. Ulcer Exam: There is no evidence of ulcer or pre-ulcerative changes or infection. Orthopedic Exam: Muscle tone and strength are WNL. No limitations in general ROM. No crepitus or effusions noted. Foot type and digits show no abnormalities. Bony prominences are unremarkable. Skin: No Porokeratosis. No infection or ulcers.  Heloma durum fifth toe left foot.  Diagnosis:  Onychomycosis, , Pain in right toe, pain in left toes,  Debride HD  Treatment & Plan Procedures and Treatment: Consent by patient was obtained for treatment procedures. The patient understood the discussion of treatment and procedures well. All questions were answered thoroughly reviewed. Debridement of mycotic and hypertrophic toenails, 1 through 5 bilateral and clearing of subungual debris. No ulceration, no infection noted. Debride corn fifth left foot. Return Visit-Office Procedure: Patient instructed to return to the office for a follow up visit 3 months for continued evaluation and treatment.    Gardiner Barefoot DPM

## 2018-08-08 ENCOUNTER — Encounter: Payer: Self-pay | Admitting: Podiatry

## 2018-08-08 ENCOUNTER — Ambulatory Visit (INDEPENDENT_AMBULATORY_CARE_PROVIDER_SITE_OTHER): Payer: Medicare Other | Admitting: Podiatry

## 2018-08-08 DIAGNOSIS — B351 Tinea unguium: Secondary | ICD-10-CM | POA: Diagnosis not present

## 2018-08-08 DIAGNOSIS — M79676 Pain in unspecified toe(s): Secondary | ICD-10-CM | POA: Diagnosis not present

## 2018-08-08 DIAGNOSIS — E1149 Type 2 diabetes mellitus with other diabetic neurological complication: Secondary | ICD-10-CM | POA: Diagnosis not present

## 2018-08-08 DIAGNOSIS — L84 Corns and callosities: Secondary | ICD-10-CM | POA: Diagnosis not present

## 2018-08-08 NOTE — Progress Notes (Signed)
Patient ID: Benjamin Blair, male   DOB: Feb 16, 1951, 67 y.o.   MRN: [id number] Complaint:  Visit Type: Patient returns to my office for continued preventative foot care services. Complaint: Patient states" my nails have grown long and thick and become painful to walk and wear shoes" Patient has been diagnosed with DM with no foot complications. The patient presents for preventative foot care services. No changes to ROS.  He relates painful corn fifth toe left foot.  Podiatric Exam: Vascular: dorsalis pedis and posterior tibial pulses are palpable bilateral. Capillary return is immediate. Temperature gradient is WNL. Skin turgor WNL  Sensorium: Diminished  Semmes Weinstein monofilament test. Normal tactile sensation bilaterally. Nail Exam: Pt has thick disfigured discolored nails with subungual debris noted bilateral entire nail second  through fifth toenails.  Absence nail plate hallux  B/LNail spicule right hallux. Ulcer Exam: There is no evidence of ulcer or pre-ulcerative changes or infection. Orthopedic Exam: Muscle tone and strength are WNL. No limitations in general ROM. No crepitus or effusions noted. Foot type and digits show no abnormalities. Bony prominences are unremarkable. Skin: No Porokeratosis. No infection or ulcers.  Heloma durum fifth toe left foot.  Diagnosis:  Onychomycosis, , Pain in right toe, pain in left toes,  Debride HD  Treatment & Plan Procedures and Treatment: Consent by patient was obtained for treatment procedures. The patient understood the discussion of treatment and procedures well. All questions were answered thoroughly reviewed. Debridement of mycotic and hypertrophic toenails, 1 through 5 bilateral and clearing of subungual debris. No ulceration, no infection noted. Debride corn fifth left foot. Return Visit-Office Procedure: Patient instructed to return to the office for a follow up visit 3 months for continued evaluation and treatment.    Gardiner Barefoot DPM

## 2018-08-21 DIAGNOSIS — R768 Other specified abnormal immunological findings in serum: Secondary | ICD-10-CM | POA: Insufficient documentation

## 2018-08-21 DIAGNOSIS — R7689 Other specified abnormal immunological findings in serum: Secondary | ICD-10-CM | POA: Insufficient documentation

## 2018-11-07 ENCOUNTER — Ambulatory Visit (INDEPENDENT_AMBULATORY_CARE_PROVIDER_SITE_OTHER): Payer: Medicare Other | Admitting: Podiatry

## 2018-11-07 ENCOUNTER — Encounter: Payer: Self-pay | Admitting: Podiatry

## 2018-11-07 DIAGNOSIS — B351 Tinea unguium: Secondary | ICD-10-CM | POA: Diagnosis not present

## 2018-11-07 DIAGNOSIS — M79676 Pain in unspecified toe(s): Secondary | ICD-10-CM | POA: Diagnosis not present

## 2018-11-07 DIAGNOSIS — L84 Corns and callosities: Secondary | ICD-10-CM

## 2018-11-07 DIAGNOSIS — E1149 Type 2 diabetes mellitus with other diabetic neurological complication: Secondary | ICD-10-CM | POA: Diagnosis not present

## 2018-11-07 NOTE — Progress Notes (Signed)
Patient ID: Benjamin Blair, male   DOB: March 25, 1951, 68 y.o.   MRN: [id number] Complaint:  Visit Type: Patient returns to my office for continued preventative foot care services. Complaint: Patient states" my nails have grown long and thick and become painful to walk and wear shoes" Patient has been diagnosed with DM with no foot complications. The patient presents for preventative foot care services. No changes to ROS.  He relates painful corn fifth toe left foot.  Podiatric Exam: Vascular: dorsalis pedis and posterior tibial pulses are palpable bilateral. Capillary return is immediate. Temperature gradient is WNL. Skin turgor WNL  Sensorium: Diminished  Semmes Weinstein monofilament test. Normal tactile sensation bilaterally. Nail Exam: Pt has thick disfigured discolored nails with subungual debris noted bilateral entire nail second  through fifth toenails.  Absence nail plate hallux  B/LNail spicule right hallux. Ulcer Exam: There is no evidence of ulcer or pre-ulcerative changes or infection. Orthopedic Exam: Muscle tone and strength are WNL. No limitations in general ROM. No crepitus or effusions noted. Foot type and digits show no abnormalities. Bony prominences are unremarkable. Skin: No Porokeratosis. No infection or ulcers.  Heloma durum fifth toe left foot.  Diagnosis:  Onychomycosis, , Pain in right toe, pain in left toes,  Debride HD  Treatment & Plan Procedures and Treatment: Consent by patient was obtained for treatment procedures. The patient understood the discussion of treatment and procedures well. All questions were answered thoroughly reviewed. Debridement of mycotic and hypertrophic toenails, 1 through 5 bilateral and clearing of subungual debris. No ulceration, no infection noted. Debride corn fifth left foot. Return Visit-Office Procedure: Patient instructed to return to the office for a follow up visit 3 months for continued evaluation and treatment.    Gardiner Barefoot DPM

## 2019-02-06 ENCOUNTER — Other Ambulatory Visit: Payer: Self-pay

## 2019-02-06 ENCOUNTER — Encounter: Payer: Self-pay | Admitting: Podiatry

## 2019-02-06 ENCOUNTER — Ambulatory Visit (INDEPENDENT_AMBULATORY_CARE_PROVIDER_SITE_OTHER): Payer: Medicare Other | Admitting: Podiatry

## 2019-02-06 DIAGNOSIS — E1149 Type 2 diabetes mellitus with other diabetic neurological complication: Secondary | ICD-10-CM | POA: Diagnosis not present

## 2019-02-06 DIAGNOSIS — M79676 Pain in unspecified toe(s): Secondary | ICD-10-CM | POA: Diagnosis not present

## 2019-02-06 DIAGNOSIS — B351 Tinea unguium: Secondary | ICD-10-CM | POA: Diagnosis not present

## 2019-02-06 NOTE — Progress Notes (Signed)
Patient ID: Benjamin Blair, male   DOB: 1951-02-27, 68 y.o.   MRN: [id number] Complaint:  Visit Type: Patient returns to my office for continued preventative foot care services. Complaint: Patient states" my nails have grown long and thick and become painful to walk and wear shoes" Patient has been diagnosed with DM with no foot complications. The patient presents for preventative foot care services. No changes to ROS.    Podiatric Exam: Vascular: dorsalis pedis and posterior tibial pulses are palpable bilateral. Capillary return is immediate. Temperature gradient is WNL. Skin turgor WNL  Sensorium: Diminished  Semmes Weinstein monofilament test. Normal tactile sensation bilaterally. Nail Exam: Pt has thick disfigured discolored nails with subungual debris noted bilateral entire nail second  through fifth toenails.  Absence nail plate hallux  B/LNail spicule right hallux. Ulcer Exam: There is no evidence of ulcer or pre-ulcerative changes or infection. Orthopedic Exam: Muscle tone and strength are WNL. No limitations in general ROM. No crepitus or effusions noted. Foot type and digits show no abnormalities. Bony prominences are unremarkable. Skin: No Porokeratosis. No infection or ulcers.  Heloma durum fifth toe left footasymptomatic  Diagnosis:  Onychomycosis, , Pain in right toe, pain in left toes,    Treatment & Plan Procedures and Treatment: Consent by patient was obtained for treatment procedures. The patient understood the discussion of treatment and procedures well. All questions were answered thoroughly reviewed. Debridement of mycotic and hypertrophic toenails, 1 through 5 bilateral and clearing of subungual debris. No ulceration, no infection noted. Return Visit-Office Procedure: Patient instructed to return to the office for a follow up visit 3 months for continued evaluation and treatment.    Gardiner Barefoot DPM

## 2019-02-28 DIAGNOSIS — R040 Epistaxis: Secondary | ICD-10-CM | POA: Insufficient documentation

## 2019-05-10 ENCOUNTER — Encounter: Payer: Self-pay | Admitting: Podiatry

## 2019-05-10 ENCOUNTER — Other Ambulatory Visit: Payer: Self-pay

## 2019-05-10 ENCOUNTER — Ambulatory Visit (INDEPENDENT_AMBULATORY_CARE_PROVIDER_SITE_OTHER): Payer: Medicare Other | Admitting: Podiatry

## 2019-05-10 VITALS — Temp 97.4°F

## 2019-05-10 DIAGNOSIS — B351 Tinea unguium: Secondary | ICD-10-CM | POA: Diagnosis not present

## 2019-05-10 DIAGNOSIS — M79676 Pain in unspecified toe(s): Secondary | ICD-10-CM

## 2019-05-10 DIAGNOSIS — E119 Type 2 diabetes mellitus without complications: Secondary | ICD-10-CM

## 2019-05-10 NOTE — Progress Notes (Signed)
Patient ID: Benjamin Blair, male   DOB: 10/22/1950, 68 y.o.   MRN: [id number] Complaint:  Visit Type: Patient returns to my office for continued preventative foot care services. Complaint: Patient states" my nails have grown long and thick and become painful to walk and wear shoes" Patient has been diagnosed with DM with no foot complications. The patient presents for preventative foot care services. No changes to ROS.    Podiatric Exam: Vascular: dorsalis pedis and posterior tibial pulses are palpable bilateral. Capillary return is immediate. Temperature gradient is WNL. Skin turgor WNL  Sensorium: Diminished  Semmes Weinstein monofilament test. Normal tactile sensation bilaterally. Nail Exam: Pt has thick disfigured discolored nails with subungual debris noted bilateral entire nail second  through fifth toenails.  Absence nail plate hallux  B/LNail spicule right hallux. Ulcer Exam: There is no evidence of ulcer or pre-ulcerative changes or infection. Orthopedic Exam: Muscle tone and strength are WNL. No limitations in general ROM. No crepitus or effusions noted. Foot type and digits show no abnormalities. Bony prominences are unremarkable. Skin: No Porokeratosis. No infection or ulcers.  Heloma durum fifth toe left footasymptomatic  Diagnosis:  Onychomycosis, , Pain in right toe, pain in left toes,    Treatment & Plan Procedures and Treatment: Consent by patient was obtained for treatment procedures. The patient understood the discussion of treatment and procedures well. All questions were answered thoroughly reviewed. Debridement of mycotic and hypertrophic toenails, 1 through 5 bilateral and clearing of subungual debris. No ulceration, no infection noted. Return Visit-Office Procedure: Patient instructed to return to the office for a follow up visit 3 months for continued evaluation and treatment.    Gardiner Barefoot DPM

## 2019-08-08 DIAGNOSIS — Z83511 Family history of glaucoma: Secondary | ICD-10-CM | POA: Insufficient documentation

## 2019-08-16 ENCOUNTER — Encounter: Payer: Self-pay | Admitting: Podiatry

## 2019-08-16 ENCOUNTER — Encounter (INDEPENDENT_AMBULATORY_CARE_PROVIDER_SITE_OTHER): Payer: Self-pay

## 2019-08-16 ENCOUNTER — Ambulatory Visit (INDEPENDENT_AMBULATORY_CARE_PROVIDER_SITE_OTHER): Payer: Medicare Other | Admitting: Podiatry

## 2019-08-16 ENCOUNTER — Other Ambulatory Visit: Payer: Self-pay

## 2019-08-16 DIAGNOSIS — B351 Tinea unguium: Secondary | ICD-10-CM

## 2019-08-16 DIAGNOSIS — E119 Type 2 diabetes mellitus without complications: Secondary | ICD-10-CM

## 2019-08-16 DIAGNOSIS — M79676 Pain in unspecified toe(s): Secondary | ICD-10-CM | POA: Diagnosis not present

## 2019-08-16 NOTE — Progress Notes (Signed)
Patient ID: Benjamin Blair, male   DOB: Jul 16, 1951, 68 y.o.   MRN: [id number] Complaint:  Visit Type: Patient returns to my office for continued preventative foot care services. Complaint: Patient states" my nails have grown long and thick and become painful to walk and wear shoes" Patient has been diagnosed with DM with no foot complications. The patient presents for preventative foot care services. No changes to ROS.    Podiatric Exam: Vascular: dorsalis pedis and posterior tibial pulses are palpable bilateral. Capillary return is immediate. Temperature gradient is WNL. Skin turgor WNL  Sensorium: Diminished  Semmes Weinstein monofilament test. Normal tactile sensation bilaterally. Nail Exam: Pt has thick disfigured discolored nails with subungual debris noted bilateral entire nail second  through fifth toenails.  Absence nail plate hallux  B/L.  Nail spicule right hallux. Ulcer Exam: There is no evidence of ulcer or pre-ulcerative changes or infection. Orthopedic Exam: Muscle tone and strength are WNL. No limitations in general ROM. No crepitus or effusions noted. Foot type and digits show no abnormalities. Bony prominences are unremarkable. Skin: No Porokeratosis. No infection or ulcers.  Heloma durum fifth toe left foot asymptomatic  Diagnosis:  Onychomycosis, , Pain in right toe, pain in left toes,    Treatment & Plan Procedures and Treatment: Consent by patient was obtained for treatment procedures. The patient understood the discussion of treatment and procedures well. All questions were answered thoroughly reviewed. Debridement of mycotic and hypertrophic toenails, 1 through 5 bilateral and clearing of subungual debris. No ulceration, no infection noted. Return Visit-Office Procedure: Patient instructed to return to the office for a follow up visit 10 weeks  for continued evaluation and treatment.    Gardiner Barefoot DPM

## 2019-10-30 ENCOUNTER — Other Ambulatory Visit: Payer: Self-pay

## 2019-10-30 ENCOUNTER — Ambulatory Visit (INDEPENDENT_AMBULATORY_CARE_PROVIDER_SITE_OTHER): Payer: Medicare Other | Admitting: Podiatry

## 2019-10-30 ENCOUNTER — Encounter: Payer: Self-pay | Admitting: Podiatry

## 2019-10-30 DIAGNOSIS — M79676 Pain in unspecified toe(s): Secondary | ICD-10-CM | POA: Diagnosis not present

## 2019-10-30 DIAGNOSIS — E119 Type 2 diabetes mellitus without complications: Secondary | ICD-10-CM | POA: Diagnosis not present

## 2019-10-30 DIAGNOSIS — B351 Tinea unguium: Secondary | ICD-10-CM

## 2019-10-30 NOTE — Progress Notes (Signed)
Patient ID: Benjamin Blair, male   DOB: 08/23/1951, 69 y.o.   MRN: [id number] Complaint:  Visit Type: Patient returns to my office for continued preventative foot care services. Complaint: Patient states" my nails have grown long and thick and become painful to walk and wear shoes" Patient has been diagnosed with DM with no foot complications. The patient presents for preventative foot care services. No changes to ROS.    Podiatric Exam: Vascular: dorsalis pedis and posterior tibial pulses are palpable bilateral. Capillary return is immediate. Temperature gradient is WNL. Skin turgor WNL  Sensorium: Diminished  Semmes Weinstein monofilament test. Normal tactile sensation bilaterally. Nail Exam: Pt has thick disfigured discolored nails with subungual debris noted bilateral entire nail second  through fifth toenails.  Absence nail plate hallux  B/L.  Nail spicule right hallux. Ulcer Exam: There is no evidence of ulcer or pre-ulcerative changes or infection. Orthopedic Exam: Muscle tone and strength are WNL. No limitations in general ROM. No crepitus or effusions noted. Foot type and digits show no abnormalities. Bony prominences are unremarkable. Skin: No Porokeratosis. No infection or ulcers.  Heloma durum fifth toe left foot asymptomatic  Diagnosis:  Onychomycosis, , Pain in right toe, pain in left toes,    Treatment & Plan Procedures and Treatment: Consent by patient was obtained for treatment procedures. The patient understood the discussion of treatment and procedures well. All questions were answered thoroughly reviewed. Debridement of mycotic and hypertrophic toenails, 1 through 5 bilateral and clearing of subungual debris. No ulceration, no infection noted. Return Visit-Office Procedure: Patient instructed to return to the office for a follow up visit 10 weeks  for continued evaluation and treatment.    Gardiner Barefoot DPM

## 2019-11-23 ENCOUNTER — Ambulatory Visit: Payer: Medicare Other | Attending: Internal Medicine

## 2019-11-23 DIAGNOSIS — Z23 Encounter for immunization: Secondary | ICD-10-CM | POA: Insufficient documentation

## 2019-11-23 NOTE — Progress Notes (Signed)
   Covid-19 Vaccination Clinic  Name:  Benjamin Blair    MRN: [id number] DOB: 12/20/1950  11/23/2019  Benjamin Blair was observed post Covid-19 immunization for 30 minutes based on pre-vaccination screening without incidence. He was provided with Vaccine Information Sheet and instruction to access the V-Safe system.   Benjamin Blair was instructed to call 911 with any severe reactions post vaccine: Marland Kitchen Difficulty breathing  . Swelling of your face and throat  . A fast heartbeat  . A bad rash all over your body  . Dizziness and weakness    Immunizations Administered    Name Date Dose VIS Date Route   Moderna COVID-19 Vaccine 11/23/2019 12:01 PM 0.5 mL 09/17/2019 Intramuscular   Manufacturer: Moderna   Lot: [id number]   [place]: 80223-361-22     bs

## 2019-12-24 ENCOUNTER — Ambulatory Visit: Payer: Medicare Other | Attending: Internal Medicine

## 2019-12-24 DIAGNOSIS — Z23 Encounter for immunization: Secondary | ICD-10-CM | POA: Insufficient documentation

## 2019-12-24 NOTE — Progress Notes (Signed)
   Covid-19 Vaccination Clinic  Name:  Benjamin Blair    MRN: [id number] DOB: 04/17/1951  12/24/2019  Mr. Benjamin Blair was observed post Covid-19 immunization for 15 minutes without incident. He was provided with Vaccine Information Sheet and instruction to access the V-Safe system.   Mr. Benjamin Blair was instructed to call 911 with any severe reactions post vaccine: Marland Kitchen Difficulty breathing  . Swelling of face and throat  . A fast heartbeat  . A bad rash all over body  . Dizziness and weakness   Immunizations Administered    Name Date Dose VIS Date Route   Moderna COVID-19 Vaccine 12/24/2019 12:42 PM 0.5 mL 09/17/2019 Intramuscular   Manufacturer: Moderna   Lot: [id number]   [place]: 44458-483-50

## 2020-01-10 ENCOUNTER — Ambulatory Visit: Payer: Medicare Other | Admitting: Podiatry

## 2020-02-07 ENCOUNTER — Ambulatory Visit (INDEPENDENT_AMBULATORY_CARE_PROVIDER_SITE_OTHER): Payer: Medicare Other | Admitting: Podiatry

## 2020-02-07 ENCOUNTER — Other Ambulatory Visit: Payer: Self-pay

## 2020-02-07 ENCOUNTER — Encounter: Payer: Self-pay | Admitting: Podiatry

## 2020-02-07 VITALS — Temp 96.3°F

## 2020-02-07 DIAGNOSIS — M2011 Hallux valgus (acquired), right foot: Secondary | ICD-10-CM | POA: Diagnosis not present

## 2020-02-07 DIAGNOSIS — M2012 Hallux valgus (acquired), left foot: Secondary | ICD-10-CM | POA: Insufficient documentation

## 2020-02-07 DIAGNOSIS — M79676 Pain in unspecified toe(s): Secondary | ICD-10-CM

## 2020-02-07 DIAGNOSIS — E119 Type 2 diabetes mellitus without complications: Secondary | ICD-10-CM

## 2020-02-07 DIAGNOSIS — B351 Tinea unguium: Secondary | ICD-10-CM | POA: Diagnosis not present

## 2020-02-07 NOTE — Progress Notes (Signed)
This patient returns to my office for at risk foot care.  This patient requires this care by a professional since this patient will be at risk due to having diabetes and chronic kidney disease and history of DVT.  This patient is unable to cut nails himself since the patient cannot reach his nails.These nails are painful walking and wearing shoes.  This patient presents for at risk foot care today. Patient requests diabetic shoes.  General Appearance  Alert, conversant and in no acute stress.  Vascular  Dorsalis pedis and posterior tibial  pulses are palpable  bilaterally.  Capillary return is within normal limits  bilaterally. Temperature is within normal limits  bilaterally.  Neurologic  Senn-Weinstein monofilament wire test diminished   bilaterally. Muscle power within normal limits bilaterally.  Nails Thick disfigured discolored nails with subungual debris  from second  to fifth toes bilaterally. No evidence of bacterial infection or drainage bilaterally.  Orthopedic  No limitations of motion  feet .  No crepitus or effusions noted.  No bony pathology or digital deformities noted.  HAV  B/L.  Skin  normotropic skin with no porokeratosis noted bilaterally.  No signs of infections or ulcers noted.     Onychomycosis  Pain in right toes  Pain in left toes  Consent was obtained for treatment procedures.   Mechanical debridement of nails 1-5  bilaterally performed with a nail nipper.  Filed with dremel without incident. Patient qualifies for diabetic shoes due to DPN and HAV  B/L. Make an appointment with pedorthist.   Return office visit   3 months                   Told patient to return for periodic foot care and evaluation due to potential at risk complications.   Gardiner Barefoot DPM

## 2020-05-13 ENCOUNTER — Ambulatory Visit: Payer: Medicare Other | Admitting: Orthotics

## 2020-05-13 ENCOUNTER — Encounter: Payer: Self-pay | Admitting: Podiatry

## 2020-05-13 ENCOUNTER — Other Ambulatory Visit: Payer: Self-pay

## 2020-05-13 ENCOUNTER — Ambulatory Visit (INDEPENDENT_AMBULATORY_CARE_PROVIDER_SITE_OTHER): Payer: Medicare Other | Admitting: Podiatry

## 2020-05-13 DIAGNOSIS — M2042 Other hammer toe(s) (acquired), left foot: Secondary | ICD-10-CM

## 2020-05-13 DIAGNOSIS — M2012 Hallux valgus (acquired), left foot: Secondary | ICD-10-CM

## 2020-05-13 DIAGNOSIS — B351 Tinea unguium: Secondary | ICD-10-CM

## 2020-05-13 DIAGNOSIS — M79676 Pain in unspecified toe(s): Secondary | ICD-10-CM | POA: Diagnosis not present

## 2020-05-13 DIAGNOSIS — L84 Corns and callosities: Secondary | ICD-10-CM

## 2020-05-13 DIAGNOSIS — M2011 Hallux valgus (acquired), right foot: Secondary | ICD-10-CM

## 2020-05-13 DIAGNOSIS — E119 Type 2 diabetes mellitus without complications: Secondary | ICD-10-CM

## 2020-05-13 DIAGNOSIS — E1149 Type 2 diabetes mellitus with other diabetic neurological complication: Secondary | ICD-10-CM

## 2020-05-13 DIAGNOSIS — M2041 Other hammer toe(s) (acquired), right foot: Secondary | ICD-10-CM

## 2020-05-13 NOTE — Progress Notes (Signed)

## 2020-05-13 NOTE — Progress Notes (Signed)
This patient returns to my office for at risk foot care.  This patient requires this care by a professional since this patient will be at risk due to having diabetes and chronic kidney disease and history of DVT.  This patient is unable to cut nails himself since the patient cannot reach his nails.These nails are painful walking and wearing shoes.  This patient presents for at risk foot care today. Patient requests diabetic shoes.  General Appearance  Alert, conversant and in no acute stress.  Vascular  Dorsalis pedis and posterior tibial  pulses are palpable  bilaterally.  Capillary return is within normal limits  bilaterally. Temperature is within normal limits  bilaterally.  Neurologic  Senn-Weinstein monofilament wire test diminished   bilaterally. Muscle power within normal limits bilaterally.  Nails Thick disfigured discolored nails with subungual debris  from second  to fifth toes bilaterally. No evidence of bacterial infection or drainage bilaterally.  Orthopedic  No limitations of motion  feet .  No crepitus or effusions noted.  No bony pathology or digital deformities noted.  HAV  B/L.  Skin  normotropic skin with no porokeratosis noted bilaterally.  No signs of infections or ulcers noted.     Onychomycosis  Pain in right toes  Pain in left toes  Consent was obtained for treatment procedures.   Mechanical debridement of nails 1-5  bilaterally performed with a nail nipper.  Filed with dremel without incident. Patient qualifies for diabetic shoes due to DPN and HAV  B/L. Patient was [facility] to [facility] for diabetic shoes.   Return office visit   3 months                   Told patient to return for periodic foot care and evaluation due to potential at risk complications.   Gardiner Barefoot DPM

## 2020-05-20 ENCOUNTER — Telehealth: Payer: Self-pay | Admitting: Podiatry

## 2020-05-20 NOTE — Telephone Encounter (Signed)
Pt called stating he was to call Liliane Channel to give him the doctors name he see's for [place] to order diabetic shoes. The Dr Is Larene Beach in [place].  I have added this to pts chart and ordered pts diabetic shoes/inserts.

## 2020-06-08 ENCOUNTER — Ambulatory Visit: Payer: Medicare Other | Admitting: Orthotics

## 2020-08-19 ENCOUNTER — Telehealth: Payer: Self-pay | Admitting: Podiatry

## 2020-08-19 ENCOUNTER — Other Ambulatory Visit: Payer: Self-pay

## 2020-08-19 ENCOUNTER — Encounter: Payer: Self-pay | Admitting: Podiatry

## 2020-08-19 ENCOUNTER — Ambulatory Visit (INDEPENDENT_AMBULATORY_CARE_PROVIDER_SITE_OTHER): Payer: Medicare Other | Admitting: Podiatry

## 2020-08-19 DIAGNOSIS — M79676 Pain in unspecified toe(s): Secondary | ICD-10-CM

## 2020-08-19 DIAGNOSIS — E119 Type 2 diabetes mellitus without complications: Secondary | ICD-10-CM

## 2020-08-19 DIAGNOSIS — B351 Tinea unguium: Secondary | ICD-10-CM

## 2020-08-19 NOTE — Progress Notes (Signed)
This patient returns to my office for at risk foot care.  This patient requires this care by a professional since this patient will be at risk due to having diabetes and chronic kidney disease and history of DVT.  This patient is unable to cut nails himself since the patient cannot reach his nails.These nails are painful walking and wearing shoes.  This patient presents for at risk foot care today.   General Appearance  Alert, conversant and in no acute stress.  Vascular  Dorsalis pedis and posterior tibial  pulses are palpable  bilaterally.  Capillary return is within normal limits  bilaterally. Temperature is within normal limits  bilaterally.  Neurologic  Senn-Weinstein monofilament wire test diminished   bilaterally. Muscle power within normal limits bilaterally.  Nails Thick disfigured discolored nails with subungual debris  from second  to fifth toes bilaterally. No evidence of bacterial infection or drainage bilaterally.  Orthopedic  No limitations of motion  feet .  No crepitus or effusions noted.  No bony pathology or digital deformities noted.  HAV  B/L.  Skin  normotropic skin with no porokeratosis noted bilaterally.  No signs of infections or ulcers noted.     Onychomycosis  Pain in right toes  Pain in left toes  Consent was obtained for treatment procedures.   Mechanical debridement of nails 1-5  bilaterally performed with a nail nipper.  Filed with dremel without incident. Patient qualifies for diabetic shoes due to DPN and HAV  B/L.   Return office visit   10 weeks                Told patient to return for periodic foot care and evaluation due to potential at risk complications.   Gardiner Barefoot DPM

## 2020-08-19 NOTE — Telephone Encounter (Signed)
I would like to know if my doctor submitted forms for shoes.

## 2020-08-26 ENCOUNTER — Telehealth: Payer: Self-pay | Admitting: Podiatry

## 2020-08-26 NOTE — Telephone Encounter (Signed)
Left message per Dr Prudence Davidson for pt that we did get paperwork for the diabetic shoes and that we have the shoes and are waiting  on the inserts and I am hoping they will arrive next week but will call when they come in to get pt scheduled to pick the shoes/inserts up.

## 2020-09-16 ENCOUNTER — Telehealth: Payer: Self-pay | Admitting: Podiatry

## 2020-09-16 NOTE — Telephone Encounter (Signed)
Pt left message checking to see if his diabetic shoe inserts have arrived yet.   Returned call and told pt that they have not come in yet but I have messaged the company again and I would call him when they come in.

## 2020-09-23 ENCOUNTER — Other Ambulatory Visit (HOSPITAL_COMMUNITY): Payer: Self-pay | Admitting: Internal Medicine

## 2020-09-23 ENCOUNTER — Ambulatory Visit: Payer: Medicare Other | Attending: Internal Medicine

## 2020-09-23 DIAGNOSIS — Z23 Encounter for immunization: Secondary | ICD-10-CM

## 2020-09-23 NOTE — Progress Notes (Signed)
   Covid-19 Vaccination Clinic  Name:  Benjamin Blair    MRN: [id number] DOB: May 18, 1951  09/23/2020  Mr. Plotts was observed post Covid-19 immunization for 15 minutes without incident. He was provided with Vaccine Information Sheet and instruction to access the V-Safe system.   Vaccinated by [facility]  Mr. States was instructed to call 911 with any severe reactions post vaccine: Marland Kitchen Difficulty breathing  . Swelling of face and throat  . A fast heartbeat  . A bad rash all over body  . Dizziness and weakness   Immunizations Administered    No immunizations on file.

## 2020-09-28 ENCOUNTER — Telehealth: Payer: Self-pay | Admitting: Podiatry

## 2020-09-28 NOTE — Telephone Encounter (Signed)
Pt left message late Friday asking if diabetic inserts have came in.Marland Kitchen   Upon looking they have not shipped yet and I have sent another message to the company to see what the hold up is..   I have left a message for pt of what I know and told pt I would call when I get an update.

## 2020-10-06 ENCOUNTER — Other Ambulatory Visit: Payer: Self-pay

## 2020-10-06 ENCOUNTER — Ambulatory Visit (INDEPENDENT_AMBULATORY_CARE_PROVIDER_SITE_OTHER): Payer: Medicare Other | Admitting: Orthotics

## 2020-10-06 DIAGNOSIS — M2041 Other hammer toe(s) (acquired), right foot: Secondary | ICD-10-CM

## 2020-10-06 DIAGNOSIS — M2012 Hallux valgus (acquired), left foot: Secondary | ICD-10-CM

## 2020-10-06 DIAGNOSIS — M2042 Other hammer toe(s) (acquired), left foot: Secondary | ICD-10-CM

## 2020-10-06 DIAGNOSIS — L84 Corns and callosities: Secondary | ICD-10-CM

## 2020-10-06 DIAGNOSIS — E119 Type 2 diabetes mellitus without complications: Secondary | ICD-10-CM

## 2020-10-28 ENCOUNTER — Ambulatory Visit (INDEPENDENT_AMBULATORY_CARE_PROVIDER_SITE_OTHER): Payer: Medicare Other | Admitting: Podiatry

## 2020-10-28 ENCOUNTER — Encounter: Payer: Self-pay | Admitting: Podiatry

## 2020-10-28 ENCOUNTER — Other Ambulatory Visit: Payer: Self-pay

## 2020-10-28 DIAGNOSIS — E119 Type 2 diabetes mellitus without complications: Secondary | ICD-10-CM

## 2020-10-28 DIAGNOSIS — B351 Tinea unguium: Secondary | ICD-10-CM | POA: Diagnosis not present

## 2020-10-28 DIAGNOSIS — M2012 Hallux valgus (acquired), left foot: Secondary | ICD-10-CM | POA: Diagnosis not present

## 2020-10-28 DIAGNOSIS — M79676 Pain in unspecified toe(s): Secondary | ICD-10-CM

## 2020-10-28 DIAGNOSIS — M2042 Other hammer toe(s) (acquired), left foot: Secondary | ICD-10-CM

## 2020-10-28 DIAGNOSIS — M2041 Other hammer toe(s) (acquired), right foot: Secondary | ICD-10-CM

## 2020-10-28 NOTE — Progress Notes (Signed)
This patient returns to my office for at risk foot care.  This patient requires this care by a professional since this patient will be at risk due to having diabetes and chronic kidney disease and history of DVT.  This patient is unable to cut nails himself since the patient cannot reach his nails.These nails are painful walking and wearing shoes. Patient has received his diabetic shoes and likes them. This patient presents for at risk foot care today.   General Appearance  Alert, conversant and in no acute stress.  Vascular  Dorsalis pedis and posterior tibial  pulses are palpable  bilaterally.  Capillary return is within normal limits  bilaterally. Temperature is within normal limits  bilaterally.  Neurologic  Senn-Weinstein monofilament wire test diminished   bilaterally. Muscle power within normal limits bilaterally.  Nails Thick disfigured discolored nails with subungual debris  from second  to fifth toes bilaterally. No evidence of bacterial infection or drainage bilaterally.  Orthopedic  No limitations of motion  feet .  No crepitus or effusions noted.  No bony pathology or digital deformities noted.  HAV  B/L.  Skin  normotropic skin with no porokeratosis noted bilaterally.  No signs of infections or ulcers noted.     Onychomycosis  Pain in right toes  Pain in left toes  Consent was obtained for treatment procedures.   Mechanical debridement of nails 1-5  bilaterally performed with a nail nipper.  Filed with dremel without incident.    Return office visit   10 weeks                Told patient to return for periodic foot care and evaluation due to potential at risk complications.   Gardiner Barefoot DPM

## 2021-01-15 ENCOUNTER — Encounter: Payer: Self-pay | Admitting: Podiatry

## 2021-01-15 ENCOUNTER — Other Ambulatory Visit: Payer: Self-pay

## 2021-01-15 ENCOUNTER — Ambulatory Visit (INDEPENDENT_AMBULATORY_CARE_PROVIDER_SITE_OTHER): Payer: Medicare Other | Admitting: Podiatry

## 2021-01-15 DIAGNOSIS — B351 Tinea unguium: Secondary | ICD-10-CM

## 2021-01-15 DIAGNOSIS — E119 Type 2 diabetes mellitus without complications: Secondary | ICD-10-CM

## 2021-01-15 DIAGNOSIS — E1169 Type 2 diabetes mellitus with other specified complication: Secondary | ICD-10-CM

## 2021-01-15 NOTE — Progress Notes (Signed)
This patient returns to my office for at risk foot care.  This patient requires this care by a professional since this patient will be at risk due to having diabetes and chronic kidney disease and history of DVT.  This patient is unable to cut nails himself since the patient cannot reach his nails.These nails are painful walking and wearing shoes. Patient has received his diabetic shoes and likes them. This patient presents for at risk foot care today.   General Appearance  Alert, conversant and in no acute stress.  Vascular  Dorsalis pedis and posterior tibial  pulses are palpable  bilaterally.  Capillary return is within normal limits  bilaterally. Temperature is within normal limits  bilaterally.  Neurologic  Senn-Weinstein monofilament wire test diminished   bilaterally. Muscle power within normal limits bilaterally.  Nails Thick disfigured discolored nails with subungual debris  from second  to fifth toes bilaterally. No evidence of bacterial infection or drainage bilaterally.  Orthopedic  No limitations of motion  feet .  No crepitus or effusions noted.  No bony pathology or digital deformities noted.  HAV  B/L.  Skin  normotropic skin with no porokeratosis noted bilaterally.  No signs of infections or ulcers noted.     Onychomycosis  Pain in right toes  Pain in left toes  Consent was obtained for treatment procedures.   Mechanical debridement of nails 1-5  bilaterally performed with a nail nipper.  Filed with dremel without incident. Discussed removal nail spicule right hallux.   Return office visit   10 weeks                Told patient to return for periodic foot care and evaluation due to potential at risk complications.   Gardiner Barefoot DPM

## 2021-04-21 ENCOUNTER — Other Ambulatory Visit: Payer: Self-pay

## 2021-04-21 ENCOUNTER — Encounter: Payer: Self-pay | Admitting: Podiatry

## 2021-04-21 ENCOUNTER — Ambulatory Visit (INDEPENDENT_AMBULATORY_CARE_PROVIDER_SITE_OTHER): Payer: Medicare Other | Admitting: Podiatry

## 2021-04-21 DIAGNOSIS — E1169 Type 2 diabetes mellitus with other specified complication: Secondary | ICD-10-CM

## 2021-04-21 DIAGNOSIS — B351 Tinea unguium: Secondary | ICD-10-CM

## 2021-04-21 DIAGNOSIS — M2012 Hallux valgus (acquired), left foot: Secondary | ICD-10-CM

## 2021-04-21 DIAGNOSIS — M79676 Pain in unspecified toe(s): Secondary | ICD-10-CM | POA: Diagnosis not present

## 2021-04-21 NOTE — Progress Notes (Signed)
This patient returns to my office for at risk foot care.  This patient requires this care by a professional since this patient will be at risk due to having diabetes and chronic kidney disease and history of DVT.  This patient is unable to cut nails himself since the patient cannot reach his nails.These nails are painful walking and wearing shoes. Patient has received his diabetic shoes and likes them. This patient presents for at risk foot care today.   General Appearance  Alert, conversant and in no acute stress.  Vascular  Dorsalis pedis and posterior tibial  pulses are palpable  bilaterally.  Capillary return is within normal limits  bilaterally. Temperature is within normal limits  bilaterally.  Neurologic  Senn-Weinstein monofilament wire test diminished   bilaterally. Muscle power within normal limits bilaterally.  Nails Thick disfigured discolored nails with subungual debris  from second  to fifth toes bilaterally. No evidence of bacterial infection or drainage bilaterally.  Orthopedic  No limitations of motion  feet .  No crepitus or effusions noted.  No bony pathology or digital deformities noted.  HAV  B/L.  Skin  normotropic skin with no porokeratosis noted bilaterally.  No signs of infections or ulcers noted.     Onychomycosis  Pain in right toes  Pain in left toes  Consent was obtained for treatment procedures.   Mechanical debridement of nails 1-5  bilaterally performed with a nail nipper.  Filed with dremel without incident. Discussed removal nail spicule right hallux.   Return office visit   10 weeks                Told patient to return for periodic foot care and evaluation due to potential at risk complications.   Gardiner Barefoot DPM

## 2021-07-19 ENCOUNTER — Ambulatory Visit (INDEPENDENT_AMBULATORY_CARE_PROVIDER_SITE_OTHER): Payer: Medicare Other | Admitting: Podiatry

## 2021-07-19 ENCOUNTER — Encounter: Payer: Self-pay | Admitting: Podiatry

## 2021-07-19 ENCOUNTER — Other Ambulatory Visit: Payer: Self-pay

## 2021-07-19 DIAGNOSIS — B351 Tinea unguium: Secondary | ICD-10-CM | POA: Diagnosis not present

## 2021-07-19 DIAGNOSIS — E1169 Type 2 diabetes mellitus with other specified complication: Secondary | ICD-10-CM

## 2021-07-19 DIAGNOSIS — M2041 Other hammer toe(s) (acquired), right foot: Secondary | ICD-10-CM

## 2021-07-19 DIAGNOSIS — M2012 Hallux valgus (acquired), left foot: Secondary | ICD-10-CM

## 2021-07-19 DIAGNOSIS — M2042 Other hammer toe(s) (acquired), left foot: Secondary | ICD-10-CM

## 2021-07-19 DIAGNOSIS — M79676 Pain in unspecified toe(s): Secondary | ICD-10-CM | POA: Diagnosis not present

## 2021-07-19 NOTE — Progress Notes (Signed)
This patient returns to my office for at risk foot care.  This patient requires this care by a professional since this patient will be at risk due to having diabetes and chronic kidney disease and history of DVT.  This patient is unable to cut nails himself since the patient cannot reach his nails.These nails are painful walking and wearing shoes. Patient has received his diabetic shoes and likes them. This patient presents for at risk foot care today.   General Appearance  Alert, conversant and in no acute stress.  Vascular  Dorsalis pedis and posterior tibial  pulses are palpable  bilaterally.  Capillary return is within normal limits  bilaterally. Temperature is within normal limits  bilaterally.  Neurologic  Senn-Weinstein monofilament wire test diminished   bilaterally. Muscle power within normal limits bilaterally.  Nails Thick disfigured discolored nails with subungual debris  from second  to fifth toes bilaterally. No evidence of bacterial infection or drainage bilaterally.  Orthopedic  No limitations of motion  feet .  No crepitus or effusions noted.  No bony pathology or digital deformities noted.  HAV  B/L.  Hammer toes 2-5  B/L.  Skin  normotropic skin with no porokeratosis noted bilaterally.  No signs of infections or ulcers noted.     Onychomycosis  Pain in right toes  Pain in left toes  Consent was obtained for treatment procedures.   Mechanical debridement of nails 1-5  bilaterally performed with a nail nipper.  Filed with dremel without incident. Discussed removal nail spicule right hallux.   Return office visit   10 weeks                Told patient to return for periodic foot care and evaluation due to potential at risk complications.   Gardiner Barefoot DPM

## 2021-10-08 ENCOUNTER — Encounter: Payer: Self-pay | Admitting: Podiatry

## 2021-10-08 ENCOUNTER — Ambulatory Visit (INDEPENDENT_AMBULATORY_CARE_PROVIDER_SITE_OTHER): Payer: Medicare Other | Admitting: Podiatry

## 2021-10-08 ENCOUNTER — Other Ambulatory Visit: Payer: Self-pay

## 2021-10-08 DIAGNOSIS — M2042 Other hammer toe(s) (acquired), left foot: Secondary | ICD-10-CM

## 2021-10-08 DIAGNOSIS — E1169 Type 2 diabetes mellitus with other specified complication: Secondary | ICD-10-CM | POA: Diagnosis not present

## 2021-10-08 DIAGNOSIS — B351 Tinea unguium: Secondary | ICD-10-CM

## 2021-10-08 DIAGNOSIS — M79676 Pain in unspecified toe(s): Secondary | ICD-10-CM

## 2021-10-08 DIAGNOSIS — M2041 Other hammer toe(s) (acquired), right foot: Secondary | ICD-10-CM | POA: Diagnosis not present

## 2021-10-08 DIAGNOSIS — M2012 Hallux valgus (acquired), left foot: Secondary | ICD-10-CM | POA: Diagnosis not present

## 2021-10-08 NOTE — Progress Notes (Signed)
This patient returns to my office for at risk foot care.  This patient requires this care by a professional since this patient will be at risk due to having diabetes and chronic kidney disease and history of DVT.  This patient is unable to cut nails himself since the patient cannot reach his nails.These nails are painful walking and wearing shoes. Patient has received his diabetic shoes and likes them. This patient presents for at risk foot care today.   General Appearance  Alert, conversant and in no acute stress.  Vascular  Dorsalis pedis and posterior tibial  pulses are palpable  bilaterally.  Capillary return is within normal limits  bilaterally. Temperature is within normal limits  bilaterally.  Neurologic  Senn-Weinstein monofilament wire test diminished   bilaterally. Muscle power within normal limits bilaterally.  Nails Thick disfigured discolored nails with subungual debris  from second  to fifth toes bilaterally. No evidence of bacterial infection or drainage bilaterally.  Orthopedic  No limitations of motion  feet .  No crepitus or effusions noted.  No bony pathology or digital deformities noted.  HAV  B/L.  Hammer toes 2-5  B/L.  Skin  normotropic skin with no porokeratosis noted bilaterally.  No signs of infections or ulcers noted.     Onychomycosis  Pain in right toes  Pain in left toes  Consent was obtained for treatment procedures.   Mechanical debridement of nails 1-5  bilaterally performed with a nail nipper.  Filed with dremel without incident. Discussed removal nail spicule right hallux.   Return office visit   10 weeks                Told patient to return for periodic foot care and evaluation due to potential at risk complications.   Gardiner Barefoot DPM

## 2021-12-20 ENCOUNTER — Ambulatory Visit: Payer: Medicare Other | Admitting: Podiatry

## 2021-12-23 ENCOUNTER — Encounter: Payer: Self-pay | Admitting: Podiatry

## 2021-12-23 ENCOUNTER — Ambulatory Visit (INDEPENDENT_AMBULATORY_CARE_PROVIDER_SITE_OTHER): Payer: Medicare Other | Admitting: Podiatry

## 2021-12-23 ENCOUNTER — Other Ambulatory Visit: Payer: Self-pay

## 2021-12-23 DIAGNOSIS — E1149 Type 2 diabetes mellitus with other diabetic neurological complication: Secondary | ICD-10-CM | POA: Diagnosis not present

## 2021-12-23 DIAGNOSIS — B351 Tinea unguium: Secondary | ICD-10-CM | POA: Diagnosis not present

## 2021-12-23 DIAGNOSIS — M79676 Pain in unspecified toe(s): Secondary | ICD-10-CM | POA: Diagnosis not present

## 2021-12-23 DIAGNOSIS — E1169 Type 2 diabetes mellitus with other specified complication: Secondary | ICD-10-CM

## 2021-12-23 NOTE — Progress Notes (Signed)
This patient returns to my office for at risk foot care.  This patient requires this care by a professional since this patient will be at risk due to having diabetes and chronic kidney disease and history of DVT.  This patient is unable to cut nails himself since the patient cannot reach his nails.These nails are painful walking and wearing shoes.  This patient presents for at risk foot care today.  ? ?General Appearance  Alert, conversant and in no acute stress. ? ?Vascular  Dorsalis pedis and posterior tibial  pulses are palpable  bilaterally.  Capillary return is within normal limits  bilaterally. Temperature is within normal limits  bilaterally. ? ?Neurologic  Senn-Weinstein monofilament wire test diminished   bilaterally. Muscle power within normal limits bilaterally. ? ?Nails Thick disfigured discolored nails with subungual debris  from second  to fifth toes bilaterally. No evidence of bacterial infection or drainage bilaterally. ? ?Orthopedic  No limitations of motion  feet .  No crepitus or effusions noted.  No bony pathology or digital deformities noted.  HAV  B/L.  Hammer toes 2-5  B/L. ? ?Skin  normotropic skin with no porokeratosis noted bilaterally.  No signs of infections or ulcers noted.    ? ?Onychomycosis  Pain in right toes  Pain in left toes ? ?Consent was obtained for treatment procedures.   Mechanical debridement of nails 1-5  bilaterally performed with a nail nipper.  Filed with dremel without incident. Discussed removal nail spicule right hallux. ? ? ?Return office visit   10 weeks                Told patient to return for periodic foot care and evaluation due to potential at risk complications. ? ? ?Lorenda Peck DPM  ?

## 2021-12-24 ENCOUNTER — Ambulatory Visit: Payer: Medicare Other | Admitting: Podiatry

## 2022-03-04 ENCOUNTER — Ambulatory Visit (INDEPENDENT_AMBULATORY_CARE_PROVIDER_SITE_OTHER): Payer: Medicare Other | Admitting: Podiatry

## 2022-03-04 ENCOUNTER — Encounter: Payer: Self-pay | Admitting: Podiatry

## 2022-03-04 DIAGNOSIS — M79676 Pain in unspecified toe(s): Secondary | ICD-10-CM | POA: Diagnosis not present

## 2022-03-04 DIAGNOSIS — B351 Tinea unguium: Secondary | ICD-10-CM | POA: Diagnosis not present

## 2022-03-04 DIAGNOSIS — E1149 Type 2 diabetes mellitus with other diabetic neurological complication: Secondary | ICD-10-CM

## 2022-03-04 DIAGNOSIS — E1169 Type 2 diabetes mellitus with other specified complication: Secondary | ICD-10-CM

## 2022-03-04 NOTE — Progress Notes (Signed)
This patient returns to my office for at risk foot care.  This patient requires this care by a professional since this patient will be at risk due to having diabetes and chronic kidney disease and history of DVT.  This patient is unable to cut nails himself since the patient cannot reach his nails.These nails are painful walking and wearing shoes.  This patient presents for at risk foot care today.   General Appearance  Alert, conversant and in no acute stress.  Vascular  Dorsalis pedis and posterior tibial  pulses are palpable  bilaterally.  Capillary return is within normal limits  bilaterally. Temperature is within normal limits  bilaterally.  Neurologic  Senn-Weinstein monofilament wire test diminished   bilaterally. Muscle power within normal limits bilaterally.  Nails Thick disfigured discolored nails with subungual debris  from second  to fifth toes bilaterally. No evidence of bacterial infection or drainage bilaterally.  Orthopedic  No limitations of motion  feet .  No crepitus or effusions noted.  No bony pathology or digital deformities noted.  HAV  B/L.  Hammer toes 2-5  B/L.  Skin  normotropic skin with no porokeratosis noted bilaterally.  No signs of infections or ulcers noted.     Onychomycosis  Pain in right toes  Pain in left toes  Consent was obtained for treatment procedures.   Mechanical debridement of nails 1-5  bilaterally performed with a nail nipper.  Filed with dremel without incident. Discussed removal nail spicule right hallux.   Return office visit   10 weeks                Told patient to return for periodic foot care and evaluation due to potential at risk complications.   Lorenda Peck DPM

## 2022-05-20 ENCOUNTER — Encounter: Payer: Self-pay | Admitting: Podiatry

## 2022-05-20 ENCOUNTER — Ambulatory Visit (INDEPENDENT_AMBULATORY_CARE_PROVIDER_SITE_OTHER): Payer: Medicare Other | Admitting: Podiatry

## 2022-05-20 DIAGNOSIS — B351 Tinea unguium: Secondary | ICD-10-CM

## 2022-05-20 DIAGNOSIS — M79676 Pain in unspecified toe(s): Secondary | ICD-10-CM | POA: Diagnosis not present

## 2022-05-20 DIAGNOSIS — E1169 Type 2 diabetes mellitus with other specified complication: Secondary | ICD-10-CM

## 2022-05-20 NOTE — Progress Notes (Signed)
This patient returns to my office for at risk foot care.  This patient requires this care by a professional since this patient will be at risk due to having diabetes and chronic kidney disease and history of DVT.  This patient is unable to cut nails himself since the patient cannot reach his nails.These nails are painful walking and wearing shoes.  This patient presents for at risk foot care today.   General Appearance  Alert, conversant and in no acute stress.  Vascular  Dorsalis pedis and posterior tibial  pulses are palpable  bilaterally.  Capillary return is within normal limits  bilaterally. Temperature is within normal limits  bilaterally.  Neurologic  Senn-Weinstein monofilament wire test diminished   bilaterally. Muscle power within normal limits bilaterally.  Nails Thick disfigured discolored nails with subungual debris  from second  to fifth toes bilaterally. No evidence of bacterial infection or drainage bilaterally.  Orthopedic  No limitations of motion  feet .  No crepitus or effusions noted.  No bony pathology or digital deformities noted.  HAV  B/L.  Hammer toes 2-5  B/L.  Skin  normotropic skin with no porokeratosis noted bilaterally.  No signs of infections or ulcers noted.     Onychomycosis  Pain in right toes  Pain in left toes  Consent was obtained for treatment procedures.   Mechanical debridement of nails 1-5  bilaterally performed with a nail nipper.  Filed with dremel without incident. Discussed removal nail spicule right hallux.   Return office visit   10 weeks                Told patient to return for periodic foot care and evaluation due to potential at risk complications.   Lorenda Peck DPM

## 2022-08-05 ENCOUNTER — Ambulatory Visit (INDEPENDENT_AMBULATORY_CARE_PROVIDER_SITE_OTHER): Payer: Medicare Other | Admitting: Podiatrist

## 2022-08-05 DIAGNOSIS — E1149 Type 2 diabetes mellitus with other diabetic neurological complication: Secondary | ICD-10-CM

## 2022-08-05 DIAGNOSIS — M79676 Pain in unspecified toe(s): Secondary | ICD-10-CM

## 2022-08-05 DIAGNOSIS — B351 Tinea unguium: Secondary | ICD-10-CM | POA: Diagnosis not present

## 2022-08-05 NOTE — Progress Notes (Signed)
Subjective: Benjamin Blair is a 71 y.o. male patient with history of diabetes who presents to office today with a chief concern of long,mildly painful nails  while ambulating in shoes; unable to trim.  Patient denies any new cramping, numbness, burning or tingling in the legs. He has a history of DVT and relates no problems recently.  Patient denies any new changes in medication or new problems.  Larene Beach, MD  is his primary care provider.  Patient Active Problem List   Diagnosis Date Noted   Hammer toes, bilateral 05/13/2020   Hav (hallux abducto valgus), left 02/07/2020   Hav (hallux abducto valgus), right 02/07/2020   Family history of glaucoma 08/08/2019   Diabetes mellitus without complication ([place]) 53/97/6734   Bleeding nose 02/28/2019   Elevated serum immunoglobulin free light chain level 08/21/2018   Hx of dissecting abdominal aortic aneurysm repair 02/03/2018   Morbid obesity ([place]) 02/03/2018   Type 2 diabetes mellitus without retinopathy ([place]) 02/03/2018   Partial small bowel obstruction ([place]) 02/01/2018   Glaucoma suspect of both eyes 07/13/2017   Hyperopia with astigmatism and presbyopia, bilateral 07/13/2017   Long term current use of oral hypoglycemic drug 07/13/2017   Nuclear sclerotic cataract of both eyes 07/13/2017   Vitreous floaters, bilateral 07/13/2017   Chronic kidney disease, stage 3 (moderate) 02/10/2016   Proteinuria 02/10/2016   Cholelithiasis with acute on chronic cholecystitis 01/14/2016   Malignant neoplasm of lung ([place]) 12/28/2015   Hematuria 11/09/2015   Hemospermia 11/09/2015   Mixed incontinence 08/17/2015   Benign prostatic hyperplasia with lower urinary tract symptoms 07/20/2015   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   Incontinence 07/20/2015   Obesity (BMI 30.0-34.9) 12/12/2014   Diabetes ([place]) 11/17/2014   Essential hypertension 11/17/2014   Hyperlipidemia 11/17/2014   Acute pulmonary embolism ([place]) 11/17/2014    Atrial fibrillation ([place]) 11/17/2014   Deep vein thrombosis ([place]) 11/17/2014   Obstructive sleep apnea (adult) (pediatric) 11/17/2014   Aortic dissection ([place]) 09/16/2014   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   6   amiodarone (PACERONE) 200 MG tablet Take 1 tablet by mouth daily.     amLODipine (NORVASC) 10 MG tablet Take one every morning     amLODipine (NORVASC) 10 MG tablet Take 1 tablet by mouth every morning.     Ascorbic Acid (VITAMIN C) 1000 MG tablet Take by mouth.     aspirin 81 MG EC tablet Take 1 tablet by mouth daily.     aspirin EC 81 MG tablet Take 81 mg by mouth daily.     Blood Glucose Monitoring Suppl (GLUCOCOM BLOOD GLUCOSE MONITOR) DEVI Patient check blood glucose twice a day     Cholecalciferol 25 MCG (1000 UT) tablet Take by mouth.     cloNIDine (CATAPRES) 0.2 MG tablet Take 0.2 mg by mouth 3 (three) times daily.     Continuous Blood Gluc Receiver (FREESTYLE LIBRE 14 DAY READER) DEVI      Continuous Blood Gluc Receiver (FREESTYLE LIBRE READER) DEVI Scan as needed.     Continuous Blood Gluc Sensor ([place]) MISC Inject into the skin.     diphenhydrAMINE (BENADRYL) 25 MG tablet Take 2 tablets (50 mg total) by mouth every 6 (six) hours as needed for itching (hives). 20 tablet 0   FREESTYLE LITE test strip 2 (two)  times daily.     labetalol (NORMODYNE) 300 MG tablet 1 tablet by mouth 3 times a day     labetalol (NORMODYNE) 300 MG tablet Take 1 tablet by mouth 3 (three) times daily.     latanoprost (XALATAN) 0.005 % ophthalmic solution Place 1 drop into the left eye at bedtime.     linagliptin (TRADJENTA) 5 MG TABS tablet Take 5 mg by mouth daily.     lisinopril (ZESTRIL) 40 MG tablet SMARTSIG:1 Tablet(s) By Mouth Daily     simvastatin (ZOCOR) 20 MG tablet Take 1 tablet by mouth at bedtime.      UNABLE TO FIND Take by mouth.     UNABLE TO FIND Inject into the skin.     UNABLE TO FIND Scan as needed.     UNABLE TO FIND 1 strip by Misc.(Non-Drug; Combo Route) route in the morning and at bedtime. FreeStyle Lite Blood Glucose Test Strips     warfarin (COUMADIN) 4 MG tablet Take 4 mg by mouth daily.   1   No current facility-administered medications on file prior to visit.   Allergies  Allergen Reactions   Linagliptin Rash and Hives   Iodinated Contrast Media Other (See Comments)    Intolerance per patient Interaction with cardiac medication      Objective: General Appearance  Alert, conversant and in no acute stress.   Vascular  Dorsalis pedis and posterior tibial  pulses are palpable  bilaterally.  Capillary return is within normal limits  bilaterally. Temperature is within normal limits  bilaterally.   Neurologic  Senn-Weinstein monofilament wire test diminished   bilaterally. Muscle power within normal limits bilaterally.   Nails Thick disfigured discolored nails with subungual debris  from second  to fifth toes bilaterally. No evidence of bacterial infection or drainage bilaterally.   Orthopedic  No limitations of motion  feet .  No crepitus or effusions noted.  No bony pathology or digital deformities noted.  HAV  B/L.  Hammer toes 2-5  B/L.   Skin  normotropic skin with no porokeratosis noted bilaterally.  No signs of infections or ulcers noted.      Assessment and Plan: Problem List Items Addressed This Visit   None    ICD-10-CM   1. Pain due to onychomycosis of toenail  B35.1    M79.676     2. 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 -Mechanically debrided all nails 2-5 bilateral using sterile nail nipper and filed with dremel without incident  -Answered all patient questions -Patient to return  in 3 months for at risk foot care -Patient advised to call the office if any  problems or questions arise in the meantime.  Bronson Ing, DPM

## 2022-10-28 ENCOUNTER — Other Ambulatory Visit: Payer: Medicare Other

## 2022-11-03 NOTE — Progress Notes (Addendum)
Patient presents to the office today for diabetic shoe and insole measuring.  Patient was measured with brannock device to determine size and width for 1 pair of extra depth shoes and foam casted for 3 pair of insoles.   ABN signed.   Documentation of medical necessity will be sent to patient's treating diabetic doctor to verify and sign.   Patient's diabetic provider: Herminio Commons, DO  NPI: [id number]   Shoes and insoles will be ordered at that time and patient will be notified for an appointment for fitting when they arrive.   Patient shoe selection-   1st   Shoe choice:   Orthofeet 672  Shoe size ordered: Men's 13 Wide   ~Shoe not available in half sizes, so ordered up to 13

## 2022-11-04 ENCOUNTER — Encounter: Payer: Self-pay | Admitting: Podiatry

## 2022-11-04 ENCOUNTER — Ambulatory Visit (INDEPENDENT_AMBULATORY_CARE_PROVIDER_SITE_OTHER): Payer: Medicare Other | Admitting: Podiatry

## 2022-11-04 ENCOUNTER — Ambulatory Visit: Payer: Medicare Other | Admitting: *Deleted

## 2022-11-04 DIAGNOSIS — E1149 Type 2 diabetes mellitus with other diabetic neurological complication: Secondary | ICD-10-CM

## 2022-11-04 DIAGNOSIS — M2041 Other hammer toe(s) (acquired), right foot: Secondary | ICD-10-CM

## 2022-11-04 DIAGNOSIS — M2012 Hallux valgus (acquired), left foot: Secondary | ICD-10-CM

## 2022-11-04 DIAGNOSIS — E1169 Type 2 diabetes mellitus with other specified complication: Secondary | ICD-10-CM

## 2022-11-04 DIAGNOSIS — B351 Tinea unguium: Secondary | ICD-10-CM

## 2022-11-04 DIAGNOSIS — M79676 Pain in unspecified toe(s): Secondary | ICD-10-CM | POA: Diagnosis not present

## 2022-11-04 NOTE — Progress Notes (Signed)
Subjective: Benjamin Blair is a 72 y.o. male patient with history of diabetes who presents to office today with a chief concern of long,mildly painful nails  while ambulating in shoes; unable to trim.  Patient denies any new cramping, numbness, burning or tingling in the legs. He has a history of DVT and relates no problems recently.  Patient denies any new changes in medication or new problems.  Alain Honey, MD  is his primary care provider.  Patient Active Problem List   Diagnosis Date Noted   Hammer toes, bilateral 05/13/2020   Hav (hallux abducto valgus), left 02/07/2020   Hav (hallux abducto valgus), right 02/07/2020   Family history of glaucoma 08/08/2019   Diabetes mellitus without complication (HCC) 05/10/2019   Bleeding nose 02/28/2019   Elevated serum immunoglobulin free light chain level 08/21/2018   Hx of dissecting abdominal aortic aneurysm repair 02/03/2018   Morbid obesity (HCC) 02/03/2018   Type 2 diabetes mellitus without retinopathy (HCC) 02/03/2018   Partial small bowel obstruction (HCC) 02/01/2018   Glaucoma suspect of both eyes 07/13/2017   Hyperopia with astigmatism and presbyopia, bilateral 07/13/2017   Long term current use of oral hypoglycemic drug 07/13/2017   Nuclear sclerotic cataract of both eyes 07/13/2017   Vitreous floaters, bilateral 07/13/2017   Chronic kidney disease, stage 3 (moderate) 02/10/2016   Proteinuria 02/10/2016   Cholelithiasis with acute on chronic cholecystitis 01/14/2016   Malignant neoplasm of lung (HCC) 12/28/2015   Hematuria 11/09/2015   Hemospermia 11/09/2015   Mixed incontinence 08/17/2015   Benign prostatic hyperplasia with lower urinary tract symptoms 07/20/2015   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   Incontinence 07/20/2015   Obesity (BMI 30.0-34.9) 12/12/2014   Diabetes (HCC) 11/17/2014   Essential hypertension 11/17/2014   Hyperlipidemia 11/17/2014   Acute pulmonary embolism (HCC) 11/17/2014    Atrial fibrillation (HCC) 11/17/2014   Deep vein thrombosis (HCC) 11/17/2014   Obstructive sleep apnea (adult) (pediatric) 11/17/2014   Aortic dissection (HCC) 09/16/2014   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   6   amiodarone (PACERONE) 200 MG tablet Take 1 tablet by mouth daily.     amLODipine (NORVASC) 10 MG tablet Take one every morning     amLODipine (NORVASC) 10 MG tablet Take 1 tablet by mouth every morning.     Ascorbic Acid (VITAMIN C) 1000 MG tablet Take by mouth.     aspirin 81 MG EC tablet Take 1 tablet by mouth daily.     aspirin EC 81 MG tablet Take 81 mg by mouth daily.     Blood Glucose Monitoring Suppl (GLUCOCOM BLOOD GLUCOSE MONITOR) DEVI Patient check blood glucose twice a day     Cholecalciferol 25 MCG (1000 UT) tablet Take by mouth.     cloNIDine (CATAPRES) 0.2 MG tablet Take 0.2 mg by mouth 3 (three) times daily.     Continuous Blood Gluc Receiver (FREESTYLE LIBRE 14 DAY READER) DEVI      Continuous Blood Gluc Receiver (FREESTYLE LIBRE READER) DEVI Scan as needed.     Continuous Blood Gluc Sensor (FREESTYLE LIBRE SENSOR SYSTEM) MISC Inject into the skin.     diphenhydrAMINE (BENADRYL) 25 MG tablet Take 2 tablets (50 mg total) by mouth every 6 (six) hours as needed for itching (hives). 20 tablet 0   FREESTYLE LITE test strip 2 (two)  times daily.     labetalol (NORMODYNE) 300 MG tablet 1 tablet by mouth 3 times a day     labetalol (NORMODYNE) 300 MG tablet Take 1 tablet by mouth 3 (three) times daily.     latanoprost (XALATAN) 0.005 % ophthalmic solution Place 1 drop into the left eye at bedtime.     linagliptin (TRADJENTA) 5 MG TABS tablet Take 5 mg by mouth daily.     lisinopril (ZESTRIL) 40 MG tablet SMARTSIG:1 Tablet(s) By Mouth Daily     simvastatin (ZOCOR) 20 MG tablet Take 1 tablet by mouth at bedtime.      UNABLE TO FIND Take by mouth.     UNABLE TO FIND Inject into the skin.     UNABLE TO FIND Scan as needed.     UNABLE TO FIND 1 strip by Misc.(Non-Drug; Combo Route) route in the morning and at bedtime. FreeStyle Lite Blood Glucose Test Strips     warfarin (COUMADIN) 4 MG tablet Take 4 mg by mouth daily.   1   No current facility-administered medications on file prior to visit.   Allergies  Allergen Reactions   Linagliptin Rash and Hives   Iodinated Contrast Media Other (See Comments)    Intolerance per patient Interaction with cardiac medication      Objective: General Appearance  Alert, conversant and in no acute stress.   Vascular  Dorsalis pedis and posterior tibial  pulses are palpable  bilaterally.  Capillary return is within normal limits  bilaterally. Temperature is within normal limits  bilaterally.   Neurologic  Senn-Weinstein monofilament wire test diminished   bilaterally. Muscle power within normal limits bilaterally.   Nails Thick disfigured discolored nails with subungual debris  from second  to fifth toes bilaterally. No evidence of bacterial infection or drainage bilaterally.   Orthopedic  No limitations of motion  feet .  No crepitus or effusions noted.  No bony pathology or digital deformities noted.  HAV  B/L.  Hammer toes 2-5  B/L.   Skin  normotropic skin with no porokeratosis noted bilaterally.  No signs of infections or ulcers noted.      Assessment and Plan: Problem List Items Addressed This Visit       Endocrine   Diabetes (HCC)   Other Visit Diagnoses     Pain due to onychomycosis of toenail    -  Primary   Other diabetic neurological complication associated with type 2 diabetes mellitus (HCC)            ICD-10-CM   1. Pain due to onychomycosis of toenail  B35.1    M79.676     2. Type 2 diabetes mellitus with other specified complication, without long-term current use of insulin (HCC)  E11.69     3. Other diabetic neurological  complication associated with type 2 diabetes mellitus (HCC)  E11.49        -Examined patient. -Mechanically debrided all nails 2-5 bilateral using sterile nail nipper and filed with dremel without incident  -Answered all patient questions -Patient to return  in 3 months for at risk foot care -Patient advised to call the office if any problems or questions arise in the meantime.  Louann Sjogren, DPM

## 2022-11-04 NOTE — Addendum Note (Signed)
Addended by: Lottie Rater E on: 11/04/2022 01:46 PM   Modules accepted: Orders

## 2022-12-13 ENCOUNTER — Telehealth: Payer: Self-pay | Admitting: Podiatry

## 2022-12-13 NOTE — Telephone Encounter (Signed)
Left message on vm for patient to call and schedule picking up diabetic shoes    Expires 02/07/23

## 2023-01-16 ENCOUNTER — Other Ambulatory Visit: Payer: Medicare Other

## 2023-01-27 ENCOUNTER — Other Ambulatory Visit: Payer: Medicare Other

## 2023-02-03 ENCOUNTER — Ambulatory Visit: Payer: Medicare Other | Admitting: Podiatry

## 2023-02-10 ENCOUNTER — Ambulatory Visit (INDEPENDENT_AMBULATORY_CARE_PROVIDER_SITE_OTHER): Payer: Medicare Other | Admitting: Podiatry

## 2023-02-10 ENCOUNTER — Encounter: Payer: Self-pay | Admitting: Podiatry

## 2023-02-10 DIAGNOSIS — B351 Tinea unguium: Secondary | ICD-10-CM | POA: Diagnosis not present

## 2023-02-10 DIAGNOSIS — E1169 Type 2 diabetes mellitus with other specified complication: Secondary | ICD-10-CM

## 2023-02-10 DIAGNOSIS — M79676 Pain in unspecified toe(s): Secondary | ICD-10-CM

## 2023-02-10 NOTE — Progress Notes (Signed)
Subjective: Benjamin Blair is a 72 y.o. male patient with history of diabetes who presents to office today with a chief concern of long,mildly painful nails  while ambulating in shoes; unable to trim.  Patient denies any new cramping, numbness, burning or tingling in the legs. He has a history of DVT and relates no problems recently.  Patient denies any new changes in medication or new problems.  Larene Beach, MD  is his primary care provider.  Patient Active Problem List   Diagnosis Date Noted   Hammer toes, bilateral 05/13/2020   Hav (hallux abducto valgus), left 02/07/2020   Hav (hallux abducto valgus), right 02/07/2020   Family history of glaucoma 08/08/2019   Diabetes mellitus without complication ([place]) 53/97/6734   Bleeding nose 02/28/2019   Elevated serum immunoglobulin free light chain level 08/21/2018   Hx of dissecting abdominal aortic aneurysm repair 02/03/2018   Morbid obesity ([place]) 02/03/2018   Type 2 diabetes mellitus without retinopathy ([place]) 02/03/2018   Partial small bowel obstruction ([place]) 02/01/2018   Glaucoma suspect of both eyes 07/13/2017   Hyperopia with astigmatism and presbyopia, bilateral 07/13/2017   Long term current use of oral hypoglycemic drug 07/13/2017   Nuclear sclerotic cataract of both eyes 07/13/2017   Vitreous floaters, bilateral 07/13/2017   Chronic kidney disease, stage 3 (moderate) 02/10/2016   Proteinuria 02/10/2016   Cholelithiasis with acute on chronic cholecystitis 01/14/2016   Malignant neoplasm of lung ([place]) 12/28/2015   Hematuria 11/09/2015   Hemospermia 11/09/2015   Mixed incontinence 08/17/2015   Benign prostatic hyperplasia with lower urinary tract symptoms 07/20/2015   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   Incontinence 07/20/2015   Obesity (BMI 30.0-34.9) 12/12/2014   Diabetes ([place]) 11/17/2014   Essential hypertension 11/17/2014   Hyperlipidemia 11/17/2014   Acute pulmonary embolism ([place]) 11/17/2014    Atrial fibrillation ([place]) 11/17/2014   Deep vein thrombosis ([place]) 11/17/2014   Obstructive sleep apnea (adult) (pediatric) 11/17/2014   Aortic dissection ([place]) 09/16/2014   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   6   amiodarone (PACERONE) 200 MG tablet Take 1 tablet by mouth daily.     amLODipine (NORVASC) 10 MG tablet Take one every morning     amLODipine (NORVASC) 10 MG tablet Take 1 tablet by mouth every morning.     Ascorbic Acid (VITAMIN C) 1000 MG tablet Take by mouth.     aspirin 81 MG EC tablet Take 1 tablet by mouth daily.     aspirin EC 81 MG tablet Take 81 mg by mouth daily.     Blood Glucose Monitoring Suppl (GLUCOCOM BLOOD GLUCOSE MONITOR) DEVI Patient check blood glucose twice a day     Cholecalciferol 25 MCG (1000 UT) tablet Take by mouth.     cloNIDine (CATAPRES) 0.2 MG tablet Take 0.2 mg by mouth 3 (three) times daily.     Continuous Blood Gluc Receiver (FREESTYLE LIBRE 14 DAY READER) DEVI      Continuous Blood Gluc Receiver (FREESTYLE LIBRE READER) DEVI Scan as needed.     Continuous Blood Gluc Sensor ([place]) MISC Inject into the skin.     diphenhydrAMINE (BENADRYL) 25 MG tablet Take 2 tablets (50 mg total) by mouth every 6 (six) hours as needed for itching (hives). 20 tablet 0   FREESTYLE LITE test strip 2 (two)  times daily.     labetalol (NORMODYNE) 300 MG tablet 1 tablet by mouth 3 times a day     labetalol (NORMODYNE) 300 MG tablet Take 1 tablet by mouth 3 (three) times daily.     latanoprost (XALATAN) 0.005 % ophthalmic solution Place 1 drop into the left eye at bedtime.     linagliptin (TRADJENTA) 5 MG TABS tablet Take 5 mg by mouth daily.     lisinopril (ZESTRIL) 40 MG tablet SMARTSIG:1 Tablet(s) By Mouth Daily     simvastatin (ZOCOR) 20 MG tablet Take 1 tablet by mouth at bedtime.      UNABLE TO FIND Take by mouth.     UNABLE TO FIND Inject into the skin.     UNABLE TO FIND Scan as needed.     UNABLE TO FIND 1 strip by Misc.(Non-Drug; Combo Route) route in the morning and at bedtime. FreeStyle Lite Blood Glucose Test Strips     warfarin (COUMADIN) 4 MG tablet Take 4 mg by mouth daily.   1   No current facility-administered medications on file prior to visit.   Allergies  Allergen Reactions   Linagliptin Rash and Hives   Iodinated Contrast Media Other (See Comments)    Intolerance per patient Interaction with cardiac medication      Objective: General Appearance  Alert, conversant and in no acute stress.   Vascular  Dorsalis pedis and posterior tibial  pulses are palpable  bilaterally.  Capillary return is within normal limits  bilaterally. Temperature is within normal limits  bilaterally.   Neurologic  Senn-Weinstein monofilament wire test diminished   bilaterally. Muscle power within normal limits bilaterally.   Nails Thick disfigured discolored nails with subungual debris  from second  to fifth toes bilaterally. No evidence of bacterial infection or drainage bilaterally.   Orthopedic  No limitations of motion  feet .  No crepitus or effusions noted.  No bony pathology or digital deformities noted.  HAV  B/L.  Hammer toes 2-5  B/L.   Skin  normotropic skin with no porokeratosis noted bilaterally.  No signs of infections or ulcers noted.      Assessment and Plan: Problem List Items Addressed This Visit   None   No diagnosis found.    -Examined patient. -Mechanically debrided all nails 2-5 bilateral using sterile nail nipper and filed with dremel without incident  -Answered all patient questions -Patient to return  in 3 months for at risk foot care -Patient advised to call the office if any problems or questions arise in the meantime.  Louann Sjogren, DPM

## 2023-02-17 ENCOUNTER — Ambulatory Visit (INDEPENDENT_AMBULATORY_CARE_PROVIDER_SITE_OTHER): Payer: Medicare Other | Admitting: Podiatry

## 2023-02-17 DIAGNOSIS — E1169 Type 2 diabetes mellitus with other specified complication: Secondary | ICD-10-CM

## 2023-02-17 NOTE — Progress Notes (Signed)
Patient came to pick up his orthotics. I Have him instructions and told him if he had any questions or concerns to please give [place] a call

## 2023-05-12 ENCOUNTER — Ambulatory Visit (INDEPENDENT_AMBULATORY_CARE_PROVIDER_SITE_OTHER): Payer: Medicare Other | Admitting: Podiatry

## 2023-05-12 ENCOUNTER — Encounter: Payer: Self-pay | Admitting: Podiatry

## 2023-05-12 DIAGNOSIS — M79676 Pain in unspecified toe(s): Secondary | ICD-10-CM

## 2023-05-12 DIAGNOSIS — E119 Type 2 diabetes mellitus without complications: Secondary | ICD-10-CM

## 2023-05-12 DIAGNOSIS — E1169 Type 2 diabetes mellitus with other specified complication: Secondary | ICD-10-CM

## 2023-05-12 DIAGNOSIS — B351 Tinea unguium: Secondary | ICD-10-CM

## 2023-05-12 NOTE — Progress Notes (Signed)
Subjective: Benjamin Blair is a 72 y.o. male patient with history of diabetes who presents to office today with a chief concern of long,mildly painful nails  while ambulating in shoes; unable to trim.  Patient denies any new cramping, numbness, burning or tingling in the legs. He has a history of DVT and relates no problems recently.  Patient denies any new changes in medication or new problems.  Alain Honey, MD  is his primary care provider.  Patient Active Problem List   Diagnosis Date Noted   Hammer toes, bilateral 05/13/2020   Hav (hallux abducto valgus), left 02/07/2020   Hav (hallux abducto valgus), right 02/07/2020   Family history of glaucoma 08/08/2019   Diabetes mellitus without complication (HCC) 05/10/2019   Bleeding nose 02/28/2019   Elevated serum immunoglobulin free light chain level 08/21/2018   Hx of dissecting abdominal aortic aneurysm repair 02/03/2018   Morbid obesity (HCC) 02/03/2018   Type 2 diabetes mellitus without retinopathy (HCC) 02/03/2018   Partial small bowel obstruction (HCC) 02/01/2018   Glaucoma suspect of both eyes 07/13/2017   Hyperopia with astigmatism and presbyopia, bilateral 07/13/2017   Long term current use of oral hypoglycemic drug 07/13/2017   Nuclear sclerotic cataract of both eyes 07/13/2017   Vitreous floaters, bilateral 07/13/2017   Chronic kidney disease, stage 3 (moderate) 02/10/2016   Proteinuria 02/10/2016   Cholelithiasis with acute on chronic cholecystitis 01/14/2016   Malignant neoplasm of lung (HCC) 12/28/2015   Hematuria 11/09/2015   Hemospermia 11/09/2015   Mixed incontinence 08/17/2015   Benign prostatic hyperplasia with lower urinary tract symptoms 07/20/2015   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   Incontinence 07/20/2015   Obesity (BMI 30.0-34.9) 12/12/2014   Diabetes (HCC) 11/17/2014   Essential hypertension 11/17/2014   Hyperlipidemia 11/17/2014   Acute pulmonary embolism (HCC) 11/17/2014    Atrial fibrillation (HCC) 11/17/2014   Deep vein thrombosis (HCC) 11/17/2014   Obstructive sleep apnea (adult) (pediatric) 11/17/2014   Aortic dissection (HCC) 09/16/2014   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   6   amiodarone (PACERONE) 200 MG tablet Take 1 tablet by mouth daily.     amLODipine (NORVASC) 10 MG tablet Take one every morning     amLODipine (NORVASC) 10 MG tablet Take 1 tablet by mouth every morning.     Ascorbic Acid (VITAMIN C) 1000 MG tablet Take by mouth.     aspirin 81 MG EC tablet Take 1 tablet by mouth daily.     aspirin EC 81 MG tablet Take 81 mg by mouth daily.     Blood Glucose Monitoring Suppl (GLUCOCOM BLOOD GLUCOSE MONITOR) DEVI Patient check blood glucose twice a day     Cholecalciferol 25 MCG (1000 UT) tablet Take by mouth.     cloNIDine (CATAPRES) 0.2 MG tablet Take 0.2 mg by mouth 3 (three) times daily.     Continuous Blood Gluc Receiver (FREESTYLE LIBRE 14 DAY READER) DEVI      Continuous Blood Gluc Receiver (FREESTYLE LIBRE READER) DEVI Scan as needed.     Continuous Blood Gluc Sensor (FREESTYLE LIBRE SENSOR SYSTEM) MISC Inject into the skin.     diphenhydrAMINE (BENADRYL) 25 MG tablet Take 2 tablets (50 mg total) by mouth every 6 (six) hours as needed for itching (hives). 20 tablet 0   FREESTYLE LITE test strip 2 (two)  times daily.     labetalol (NORMODYNE) 300 MG tablet 1 tablet by mouth 3 times a day     labetalol (NORMODYNE) 300 MG tablet Take 1 tablet by mouth 3 (three) times daily.     latanoprost (XALATAN) 0.005 % ophthalmic solution Place 1 drop into the left eye at bedtime.     linagliptin (TRADJENTA) 5 MG TABS tablet Take 5 mg by mouth daily.     lisinopril (ZESTRIL) 40 MG tablet SMARTSIG:1 Tablet(s) By Mouth Daily     simvastatin (ZOCOR) 20 MG tablet Take 1 tablet by mouth at bedtime.      UNABLE TO FIND Take by mouth.     UNABLE TO FIND Inject into the skin.     UNABLE TO FIND Scan as needed.     UNABLE TO FIND 1 strip by Misc.(Non-Drug; Combo Route) route in the morning and at bedtime. FreeStyle Lite Blood Glucose Test Strips     warfarin (COUMADIN) 4 MG tablet Take 4 mg by mouth daily.   1   No current facility-administered medications on file prior to visit.   Allergies  Allergen Reactions   Linagliptin Rash and Hives   Iodinated Contrast Media Other (See Comments)    Intolerance per patient Interaction with cardiac medication      Objective: General Appearance  Alert, conversant and in no acute stress.   Vascular  Dorsalis pedis and posterior tibial  pulses are palpable  bilaterally.  Capillary return is within normal limits  bilaterally. Temperature is within normal limits  bilaterally.   Neurologic  Senn-Weinstein monofilament wire test diminished   bilaterally. Muscle power within normal limits bilaterally.   Nails Thick disfigured discolored nails with subungual debris  from second  to fifth toes bilaterally. No evidence of bacterial infection or drainage bilaterally.   Orthopedic  No limitations of motion  feet .  No crepitus or effusions noted.  No bony pathology or digital deformities noted.  HAV  B/L.  Hammer toes 2-5  B/L.   Skin  normotropic skin with no porokeratosis noted bilaterally.  No signs of infections or ulcers noted.      Assessment and Plan: Problem List Items Addressed This Visit       Endocrine   Diabetes (HCC)   Other Visit Diagnoses     Pain due to onychomycosis of toenail    -  Primary         ICD-10-CM   1. Pain due to onychomycosis of toenail  B35.1    M79.676     2. Type 2 diabetes mellitus with other specified complication, without long-term current use of insulin (HCC)  E11.69         -Examined patient. -Mechanically debrided all nails 2-5 bilateral using sterile nail nipper and filed with dremel without  incident  -Answered all patient questions -Patient to return  in 3 months for at risk foot care -Patient advised to call the office if any problems or questions arise in the meantime.  Louann Sjogren, DPM

## 2023-08-11 ENCOUNTER — Encounter: Payer: Self-pay | Admitting: Podiatry

## 2023-08-11 ENCOUNTER — Ambulatory Visit (INDEPENDENT_AMBULATORY_CARE_PROVIDER_SITE_OTHER): Payer: Medicare Other | Admitting: Podiatry

## 2023-08-11 DIAGNOSIS — B351 Tinea unguium: Secondary | ICD-10-CM

## 2023-08-11 DIAGNOSIS — M79676 Pain in unspecified toe(s): Secondary | ICD-10-CM | POA: Diagnosis not present

## 2023-08-11 DIAGNOSIS — E1169 Type 2 diabetes mellitus with other specified complication: Secondary | ICD-10-CM | POA: Diagnosis not present

## 2023-08-11 NOTE — Progress Notes (Signed)
Subjective: Benjamin Blair is a 72 y.o. male patient with history of diabetes who presents to office today with a chief concern of long,mildly painful nails  while ambulating in shoes; unable to trim.  Patient denies any new cramping, numbness, burning or tingling in the legs. He has a history of DVT and relates no problems recently.  Patient denies any new changes in medication or new problems.  Alain Honey, MD  is his primary care provider.  Patient Active Problem List   Diagnosis Date Noted   Hammer toes, bilateral 05/13/2020   Hav (hallux abducto valgus), left 02/07/2020   Hav (hallux abducto valgus), right 02/07/2020   Family history of glaucoma 08/08/2019   Diabetes mellitus without complication (HCC) 05/10/2019   Bleeding nose 02/28/2019   Elevated serum immunoglobulin free light chain level 08/21/2018   Hx of dissecting abdominal aortic aneurysm repair 02/03/2018   Morbid obesity (HCC) 02/03/2018   Type 2 diabetes mellitus without retinopathy (HCC) 02/03/2018   Partial small bowel obstruction (HCC) 02/01/2018   Glaucoma suspect of both eyes 07/13/2017   Hyperopia with astigmatism and presbyopia, bilateral 07/13/2017   Long term current use of oral hypoglycemic drug 07/13/2017   Nuclear sclerotic cataract of both eyes 07/13/2017   Vitreous floaters, bilateral 07/13/2017   Stage 3 chronic kidney disease (HCC) 02/10/2016   Proteinuria 02/10/2016   Cholelithiasis with acute on chronic cholecystitis 01/14/2016   Malignant neoplasm of lung (HCC) 12/28/2015   Hematuria 11/09/2015   Hemospermia 11/09/2015   Mixed incontinence 08/17/2015   Benign prostatic hyperplasia with lower urinary tract symptoms 07/20/2015   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   Incontinence 07/20/2015   Obesity (BMI 30.0-34.9) 12/12/2014   Diabetes (HCC) 11/17/2014   Essential hypertension 11/17/2014   Hyperlipidemia 11/17/2014   Acute pulmonary embolism (HCC) 11/17/2014   Atrial  fibrillation (HCC) 11/17/2014   Deep vein thrombosis (HCC) 11/17/2014   Obstructive sleep apnea (adult) (pediatric) 11/17/2014   Aortic dissection (HCC) 09/16/2014   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   6   amiodarone (PACERONE) 200 MG tablet Take 1 tablet by mouth daily.     amLODipine (NORVASC) 10 MG tablet Take one every morning     amLODipine (NORVASC) 10 MG tablet Take 1 tablet by mouth every morning.     Ascorbic Acid (VITAMIN C) 1000 MG tablet Take by mouth.     aspirin 81 MG EC tablet Take 1 tablet by mouth daily.     aspirin EC 81 MG tablet Take 81 mg by mouth daily.     Blood Glucose Monitoring Suppl (GLUCOCOM BLOOD GLUCOSE MONITOR) DEVI Patient check blood glucose twice a day     Cholecalciferol 25 MCG (1000 UT) tablet Take by mouth.     cloNIDine (CATAPRES) 0.2 MG tablet Take 0.2 mg by mouth 3 (three) times daily.     Continuous Blood Gluc Receiver (FREESTYLE LIBRE 14 DAY READER) DEVI      Continuous Blood Gluc Receiver (FREESTYLE LIBRE READER) DEVI Scan as needed.     Continuous Blood Gluc Sensor (FREESTYLE LIBRE SENSOR SYSTEM) MISC Inject into the skin.     diphenhydrAMINE (BENADRYL) 25 MG tablet Take 2 tablets (50 mg total) by mouth every 6 (six) hours as needed for itching (hives). 20 tablet 0   FREESTYLE LITE test strip 2 (two)  times daily.     labetalol (NORMODYNE) 300 MG tablet 1 tablet by mouth 3 times a day     labetalol (NORMODYNE) 300 MG tablet Take 1 tablet by mouth 3 (three) times daily.     latanoprost (XALATAN) 0.005 % ophthalmic solution Place 1 drop into the left eye at bedtime.     linagliptin (TRADJENTA) 5 MG TABS tablet Take 5 mg by mouth daily.     lisinopril (ZESTRIL) 40 MG tablet SMARTSIG:1 Tablet(s) By Mouth Daily     simvastatin (ZOCOR) 20 MG tablet Take 1 tablet by mouth at bedtime.      UNABLE TO FIND Take by mouth.     UNABLE TO FIND Inject into the skin.     UNABLE TO FIND Scan as needed.     UNABLE TO FIND 1 strip by Misc.(Non-Drug; Combo Route) route in the morning and at bedtime. FreeStyle Lite Blood Glucose Test Strips     warfarin (COUMADIN) 4 MG tablet Take 4 mg by mouth daily.   1   No current facility-administered medications on file prior to visit.   Allergies  Allergen Reactions   Linagliptin Rash and Hives   Iodinated Contrast Media Other (See Comments)    Intolerance per patient Interaction with cardiac medication      Objective: General Appearance  Alert, conversant and in no acute stress.   Vascular  Dorsalis pedis and posterior tibial  pulses are palpable  bilaterally.  Capillary return is within normal limits  bilaterally. Temperature is within normal limits  bilaterally.   Neurologic  Senn-Weinstein monofilament wire test diminished   bilaterally. Muscle power within normal limits bilaterally.   Nails Thick disfigured discolored nails with subungual debris  from second  to fifth toes bilaterally. No evidence of bacterial infection or drainage bilaterally.   Orthopedic  No limitations of motion  feet .  No crepitus or effusions noted.  No bony pathology or digital deformities noted.  HAV  B/L.  Hammer toes 2-5  B/L.   Skin  normotropic skin with no porokeratosis noted bilaterally.  No signs of infections or ulcers noted.      Assessment and Plan: Problem List Items Addressed This Visit       Endocrine   Diabetes (HCC)   Other Visit Diagnoses     Pain due to onychomycosis of toenail    -  Primary         ICD-10-CM   1. Pain due to onychomycosis of toenail  B35.1    M79.676     2. Type 2 diabetes mellitus with other specified complication, without long-term current use of insulin (HCC)  E11.69         -Examined patient. -Mechanically debrided all nails 2-5 bilateral using sterile nail nipper and filed with dremel without  incident  -Answered all patient questions -Patient to return  in 3 months for at risk foot care -Patient advised to call the office if any problems or questions arise in the meantime.  Louann Sjogren, DPM

## 2023-11-10 ENCOUNTER — Ambulatory Visit: Payer: Medicare Other | Admitting: Podiatry

## 2023-11-23 ENCOUNTER — Encounter: Payer: Self-pay | Admitting: Podiatry

## 2023-11-23 ENCOUNTER — Other Ambulatory Visit: Payer: Self-pay

## 2023-11-23 ENCOUNTER — Ambulatory Visit (INDEPENDENT_AMBULATORY_CARE_PROVIDER_SITE_OTHER): Payer: Medicare Other | Admitting: Podiatry

## 2023-11-23 ENCOUNTER — Ambulatory Visit: Payer: Medicare Other | Attending: Anesthesiology

## 2023-11-23 DIAGNOSIS — M5459 Other low back pain: Secondary | ICD-10-CM | POA: Insufficient documentation

## 2023-11-23 DIAGNOSIS — B351 Tinea unguium: Secondary | ICD-10-CM

## 2023-11-23 DIAGNOSIS — E1169 Type 2 diabetes mellitus with other specified complication: Secondary | ICD-10-CM

## 2023-11-23 DIAGNOSIS — M79651 Pain in right thigh: Secondary | ICD-10-CM | POA: Insufficient documentation

## 2023-11-23 DIAGNOSIS — M79676 Pain in unspecified toe(s): Secondary | ICD-10-CM | POA: Diagnosis not present

## 2023-11-23 DIAGNOSIS — M2012 Hallux valgus (acquired), left foot: Secondary | ICD-10-CM

## 2023-11-23 DIAGNOSIS — M2041 Other hammer toe(s) (acquired), right foot: Secondary | ICD-10-CM

## 2023-11-23 DIAGNOSIS — M25651 Stiffness of right hip, not elsewhere classified: Secondary | ICD-10-CM | POA: Diagnosis not present

## 2023-11-23 DIAGNOSIS — M25561 Pain in right knee: Secondary | ICD-10-CM | POA: Insufficient documentation

## 2023-11-23 DIAGNOSIS — M25652 Stiffness of left hip, not elsewhere classified: Secondary | ICD-10-CM | POA: Insufficient documentation

## 2023-11-23 DIAGNOSIS — M2042 Other hammer toe(s) (acquired), left foot: Secondary | ICD-10-CM

## 2023-11-23 NOTE — Progress Notes (Signed)
 Subjective: Benjamin Blair is a 73 y.o. male patient with history of diabetes who presents to office today with a chief concern of long,mildly painful nails  while ambulating in shoes; unable to trim.  Patient denies any new cramping, numbness, burning or tingling in the legs. He has a history of DVT and relates no problems recently.  Patient denies any new changes in medication or new problems.  Corean Ade, MD  is his primary care provider.  Patient Active Problem List   Diagnosis Date Noted   Hammer toes, bilateral 05/13/2020   Hav (hallux abducto valgus), left 02/07/2020   Hav (hallux abducto valgus), right 02/07/2020   Family history of glaucoma 08/08/2019   Diabetes mellitus without complication (HCC) 05/10/2019   Bleeding nose 02/28/2019   Elevated serum immunoglobulin free light chain level 08/21/2018   Hx of dissecting abdominal aortic aneurysm repair 02/03/2018   Morbid obesity (HCC) 02/03/2018   Type 2 diabetes mellitus without retinopathy (HCC) 02/03/2018   Partial small bowel obstruction (HCC) 02/01/2018   Glaucoma suspect of both eyes 07/13/2017   Hyperopia with astigmatism and presbyopia, bilateral 07/13/2017   Long term current use of oral hypoglycemic drug 07/13/2017   Nuclear sclerotic cataract of both eyes 07/13/2017   Vitreous floaters, bilateral 07/13/2017   Stage 3 chronic kidney disease (HCC) 02/10/2016   Proteinuria 02/10/2016   Cholelithiasis with acute on chronic cholecystitis 01/14/2016   Malignant neoplasm of lung (HCC) 12/28/2015   Hematuria 11/09/2015   Hemospermia 11/09/2015   Mixed incontinence 08/17/2015   Benign prostatic hyperplasia with lower urinary tract symptoms 07/20/2015   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   Incontinence 07/20/2015   Obesity (BMI 30.0-34.9) 12/12/2014   Diabetes (HCC) 11/17/2014   Essential hypertension 11/17/2014   Hyperlipidemia 11/17/2014   Acute pulmonary embolism (HCC) 11/17/2014   Atrial  fibrillation (HCC) 11/17/2014   Deep vein thrombosis (HCC) 11/17/2014   Obstructive sleep apnea (adult) (pediatric) 11/17/2014   Aortic dissection (HCC) 09/16/2014   Hx of ascending aorta replacement 09/16/2014   Current Outpatient Medications on File Prior to Visit  Medication Sig Dispense Refill   amiodarone  (PACERONE ) 200 MG tablet Take 200 mg by mouth daily.   6   amiodarone  (PACERONE ) 200 MG tablet Take 1 tablet by mouth daily.     amLODipine (NORVASC) 10 MG tablet Take one every morning     amLODipine (NORVASC) 10 MG tablet Take 1 tablet by mouth every morning.     Ascorbic Acid (VITAMIN C) 1000 MG tablet Take by mouth.     aspirin 81 MG EC tablet Take 1 tablet by mouth daily.     aspirin EC 81 MG tablet Take 81 mg by mouth daily.     Blood Glucose Monitoring Suppl (GLUCOCOM BLOOD GLUCOSE MONITOR) DEVI Patient check blood glucose twice a day     Cholecalciferol 25 MCG (1000 UT) tablet Take by mouth.     cloNIDine  (CATAPRES ) 0.2 MG tablet Take 0.2 mg by mouth 3 (three) times daily.     Continuous Blood Gluc Receiver (FREESTYLE LIBRE 14 DAY READER) DEVI      Continuous Blood Gluc Receiver (FREESTYLE LIBRE READER) DEVI Scan as needed.     Continuous Blood Gluc Sensor (FREESTYLE LIBRE SENSOR SYSTEM) MISC Inject into the skin.     diphenhydrAMINE  (BENADRYL ) 25 MG tablet Take 2 tablets (50 mg total) by mouth every 6 (six) hours as needed for itching (hives). 20 tablet 0   FREESTYLE LITE test strip 2 (two)  times daily.     labetalol  (NORMODYNE ) 300 MG tablet 1 tablet by mouth 3 times a day     labetalol  (NORMODYNE ) 300 MG tablet Take 1 tablet by mouth 3 (three) times daily.     latanoprost (XALATAN) 0.005 % ophthalmic solution Place 1 drop into the left eye at bedtime.     linagliptin (TRADJENTA) 5 MG TABS tablet Take 5 mg by mouth daily.     lisinopril  (ZESTRIL ) 40 MG tablet SMARTSIG:1 Tablet(s) By Mouth Daily     simvastatin (ZOCOR) 20 MG tablet Take 1 tablet by mouth at bedtime.      UNABLE TO FIND Take by mouth.     UNABLE TO FIND Inject into the skin.     UNABLE TO FIND Scan as needed.     UNABLE TO FIND 1 strip by Misc.(Non-Drug; Combo Route) route in the morning and at bedtime. FreeStyle Lite Blood Glucose Test Strips     warfarin (COUMADIN) 4 MG tablet Take 4 mg by mouth daily.   1   No current facility-administered medications on file prior to visit.   Allergies  Allergen Reactions   Linagliptin Rash and Hives   Iodinated Contrast Media Other (See Comments)    Intolerance per patient Interaction with cardiac medication      Objective: General Appearance  Alert, conversant and in no acute stress.   Vascular  Dorsalis pedis and posterior tibial  pulses are palpable  bilaterally.  Capillary return is within normal limits  bilaterally. Temperature is within normal limits  bilaterally.   Neurologic  Senn-Weinstein monofilament wire test diminished   bilaterally. Muscle power within normal limits bilaterally.   Nails Thick disfigured discolored nails with subungual debris  from second  to fifth toes bilaterally. No evidence of bacterial infection or drainage bilaterally.   Orthopedic  No limitations of motion  feet .  No crepitus or effusions noted.  No bony pathology or digital deformities noted.  HAV  B/L.  Hammer toes 2-5  B/L.   Skin  normotropic skin with no porokeratosis noted bilaterally.  No signs of infections or ulcers noted.      Assessment and Plan: Problem List Items Addressed This Visit       Endocrine   Diabetes (HCC)   Other Visit Diagnoses       Pain due to onychomycosis of toenail    -  Primary         ICD-10-CM   1. Pain due to onychomycosis of toenail  B35.1    M79.676     2. Type 2 diabetes mellitus with other specified complication, without long-term current use of insulin (HCC)  E11.69         -Examined patient. -Mechanically debrided all nails 2-5 bilateral using sterile nail nipper and filed with dremel without  incident  -Answered all patient questions -DM shoes ordered.  -Patient to return  in 3 months for at risk foot care -Patient advised to call the office if any problems or questions arise in the meantime.  Asberry Failing, DPM

## 2023-11-23 NOTE — Therapy (Signed)
 OUTPATIENT PHYSICAL THERAPY THORACOLUMBAR EVALUATION   Patient Name: Benjamin Blair MRN: [id number] DOB:03/16/1951, 73 y.o., male Today's Date: 11/23/2023  END OF SESSION:  PT End of Session - 11/23/23 1517     Visit Number 1    Number of Visits 16    Date for PT Re-Evaluation 01/18/24    Authorization Type Medicare & [employer]    Progress Note Due on Visit 10    PT Start Time 1405    PT Stop Time 1445    PT Time Calculation (min) 40 min    Activity Tolerance Patient tolerated treatment well    Behavior During Therapy WFL for tasks assessed/performed             Past Medical History:  Diagnosis Date   Arrhythmia    atrial fibrillation   Chronic renal insufficiency, stage III (moderate) (HCC)    Clotting disorder (HCC)    pulmonary embolism and deep vein thrombosis   Diabetes mellitus without complication (HCC)    Heart murmur    Hypertension    Sleep apnea    wears C-PAP at night   Thoracic aortic dissection [facility])    Past Surgical History:  Procedure Laterality Date   REPAIR OF ACUTE ASCENDING THORACIC AORTIC DISSECTION     Patient Active Problem List   Diagnosis Date Noted   Hammer toes, bilateral 05/13/2020   Hav (hallux abducto valgus), left 02/07/2020   Hav (hallux abducto valgus), right 02/07/2020   Family history of glaucoma 08/08/2019   Diabetes mellitus without complication (HCC) 05/10/2019   Bleeding nose 02/28/2019   Elevated serum immunoglobulin free light chain level 08/21/2018   Hx of dissecting abdominal aortic aneurysm repair 02/03/2018   Morbid obesity (HCC) 02/03/2018   Type 2 diabetes mellitus without retinopathy (HCC) 02/03/2018   Partial small bowel obstruction (HCC) 02/01/2018   Glaucoma suspect of both eyes 07/13/2017   Hyperopia with astigmatism and presbyopia, bilateral 07/13/2017   Long term current use of oral hypoglycemic drug 07/13/2017   Nuclear sclerotic cataract of both eyes 07/13/2017   Vitreous floaters, bilateral 07/13/2017    Stage 3 chronic kidney disease (HCC) 02/10/2016   Proteinuria 02/10/2016   Cholelithiasis with acute on chronic cholecystitis 01/14/2016   Malignant neoplasm of lung (HCC) 12/28/2015   Hematuria 11/09/2015   Hemospermia 11/09/2015   Mixed incontinence 08/17/2015   Benign prostatic hyperplasia with lower urinary tract symptoms 07/20/2015   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   Incontinence 07/20/2015   Obesity (BMI 30.0-34.9) 12/12/2014   Diabetes (HCC) 11/17/2014   Essential hypertension 11/17/2014   Hyperlipidemia 11/17/2014   Acute pulmonary embolism (HCC) 11/17/2014   Atrial fibrillation (HCC) 11/17/2014   Deep vein thrombosis (HCC) 11/17/2014   Obstructive sleep apnea (adult) (pediatric) 11/17/2014   Aortic dissection (HCC) 09/16/2014   Hx of ascending aorta replacement 09/16/2014    PCP: Charlie Sic, MD  REFERRING PROVIDER: Ozell Finney, MD  REFERRING DIAG: lumbar radiculopathy, low back pain, lumbar spondylosis  Rationale for Evaluation and Treatment: Rehabilitation  THERAPY DIAG:  Other low back pain  Pain in right thigh  Stiffness of right hip, not elsewhere classified  Stiffness of left hip, not elsewhere classified  ONSET DATE: 5 months ago (referral date 11/16/2023)  SUBJECTIVE:  SUBJECTIVE STATEMENT: Patient reported that about 5 months ago he started developing pain along the R hip/thigh region. He did not realize it was coming from his low back but at the visit with the Orthopedist, radiographs were taken and they discovered some stenosis. They trialed an injection and gabapentin, but neither helped with the pain. They were planning on trying another medication similar to gabapentin but the patient declined due to the potential side effects. The pain is  felt the most at night and first thing in the AM. Then, as he moves around and loosens up, the pain gets better. Sleeping on the side seems to be the most comfortable. Lying on the back is not uncomfortable at first, but then will become painful as the night goes on.  PERTINENT HISTORY:  No prior history of pain in this region or other pertinent history per patient report.   PAIN:  Are you having pain? Yes: NPRS scale: 6-7/10 Pain location: R hip/thigh region  PRECAUTIONS: None  RED FLAGS: None   WEIGHT BEARING RESTRICTIONS: No  FALLS:  Has patient fallen in last 6 months? No  PATIENT GOALS: resolve chief complaint  OBJECTIVE:  Note: Objective measures were completed at Evaluation unless otherwise noted.  DIAGNOSTIC FINDINGS:  Radiographs were taken at an outside facility - patient reported findings of stenosis  PATIENT SURVEYS:  Modified Oswestry: 5/50, 10% disability   COGNITION: Overall cognitive status: Within functional limits for tasks assessed    MUSCLE LENGTH: -Hamstrings: Right 70 deg; Left 70 deg 70 deg at knee 90/90 position -Thomas test: Right lacking 5 deg; Left lacking 5 deg; tested in sidelying with rectus femoris at length  PALPATION: No significant pain with central PA glides of the lumbar spine  LUMBAR ROM:   AROM eval  Flexion Mid shin  Extension WNL  Right lateral flexion WNL  Left lateral flexion WNL  Right rotation   Left rotation    (Blank rows = not tested)  LOWER EXTREMITY ROM:     Passive  Right eval Left eval  Hip flexion 90 90  Hip extension 0 0  Hip abduction    Hip adduction    Hip internal rotation WNL WNL  Hip external rotation 25 25  Knee flexion    Knee extension    Ankle dorsiflexion    Ankle plantarflexion    Ankle inversion    Ankle eversion     (Blank rows = not tested)  LOWER EXTREMITY MMT:    MMT Right eval Left eval  Hip flexion 5 5  Hip extension 5 5  Hip abduction 5 5  Hip adduction 5 5  Hip  internal rotation    Hip external rotation    Knee flexion    Knee extension    Ankle dorsiflexion    Ankle plantarflexion    Ankle inversion    Ankle eversion     (Blank rows = not tested)  [facility] Adult PT Treatment:                                                DATE: 11/23/2023 Therapeutic Exercise: Instructed home program (see below)  PATIENT EDUCATION:  Education details: on current presentation, on HEP, on clinical outcomes score and POC Person educated: Patient Education method: Explanation, Demonstration, and Handouts Education comprehension: verbalized understanding   HOME EXERCISE PROGRAM: Access Code: [email or web address] URL: [email or web address] Date: 11/23/2023 Prepared by: Honora Rei  Exercises - Seated Flexion Stretch  - 1 x daily - 7 x weekly - 3 sets - 10 reps - Standing Quadriceps Stretch  - 1 x daily - 7 x weekly - 3 sets - 10 reps - Supine Lower Trunk Rotation  - 1 x daily - 7 x weekly - 3 sets - 10 reps  ASSESSMENT:  CLINICAL IMPRESSION: Patient is a 73 y.o. who was seen today for physical therapy evaluation and treatment for low back/R thigh pain. Stiffness of the lumbar spine and hips seems to be the biggest contributor to symptoms as the pain is only experienced during prolonged rest while sleeping. Strength is likely not an issue as patient tested strong and also does not have pain with activity - movement helps him loosen up and is relieving without any increase in pain with extended activity. Physical therapy will focus on improving the articular and soft tissue mobility of the lumbar spine (particularly into flexion) and hips to reduce symptoms and improve ability to sleep. Physical therapy is indicated.  OBJECTIVE IMPAIRMENTS: decreased mobility, decreased ROM, hypomobility, and pain.   ACTIVITY LIMITATIONS: sleeping  REHAB  POTENTIAL: Good  CLINICAL DECISION MAKING: Stable/uncomplicated  EVALUATION COMPLEXITY: Low  GOALS: Goals reviewed with patient? yes  SHORT TERM GOALS: Target date: 12/21/2023   Patient will be independent in self management strategies to improve quality of life and functional outcomes. Baseline: New Program Goal status: INITIAL  2.  Patient will report at least 50% improvement in overall symptoms and/or function to demonstrate improved functional mobility Baseline: 0% better Goal status: INITIAL  3.  Patient will report at minimum he is able to sleep >4 hours on the Modified Oswestry questionnaire. Baseline: <4 hours Goal status: INITIAL  LONG TERM GOALS: 01/18/2024   Patient will report at least 75% improvement in overall symptoms and/or function to demonstrate improved functional mobility Baseline: 0% better Goal status: INITIAL  2.  Patient will report he is able to sleep > 6 hours on the Modified Oswestry questionnaire.  Baseline: <4 hours  Goal status: INITIAL  3.  Patient will demonstrate improve spinal mobility by being able to reach ankles with standing trunk flexion. Baseline: mid shin Goal status: INITIAL  PLAN:  PT FREQUENCY: 2x/week  PT DURATION: 8 weeks  PLANNED INTERVENTIONS: 97110-Therapeutic exercises, 97530- Therapeutic activity, 97112- Neuromuscular re-education, [phone]- Self Care, [id number]- Manual therapy, [phone]- Gait training, [phone]- Orthotic Fit/training, [phone]- Canalith repositioning, [id number]- Aquatic Therapy, [id number]- Electrical stimulation (unattended), [phone]- Ionotophoresis 4mg /ml Dexamethasone, Patient/Family education, Balance training, Stair training, Taping, Dry Needling, Joint mobilization, Joint manipulation, Spinal manipulation, Spinal mobilization, Cryotherapy, and Moist heat   PLAN FOR NEXT SESSION: lumbar mobility exercises (particularly into flexion), hip mobility exercises, soft tissue stretching, joint/soft tissue mobilization to the lumbar and  hip regions as indicated  Honora GORMAN Rei, PT 11/23/2023, 4:54 PM

## 2023-11-30 ENCOUNTER — Ambulatory Visit: Payer: Medicare Other

## 2023-11-30 DIAGNOSIS — M25652 Stiffness of left hip, not elsewhere classified: Secondary | ICD-10-CM

## 2023-11-30 DIAGNOSIS — M5459 Other low back pain: Secondary | ICD-10-CM | POA: Diagnosis not present

## 2023-11-30 DIAGNOSIS — M79651 Pain in right thigh: Secondary | ICD-10-CM

## 2023-11-30 DIAGNOSIS — M25651 Stiffness of right hip, not elsewhere classified: Secondary | ICD-10-CM

## 2023-11-30 NOTE — Therapy (Signed)
OUTPATIENT PHYSICAL THERAPY THORACOLUMBAR TREATMENT   Patient Name: Benjamin Blair MRN: [id number] DOB:1951/07/17, 73 y.o., male Today's Date: 11/30/2023  END OF SESSION:  PT End of Session - 11/30/23 1145     Visit Number 2    Number of Visits 16    Date for PT Re-Evaluation 01/18/24    Authorization Type Medicare & AARP    Progress Note Due on Visit 10    PT Start Time 0805    PT Stop Time 0845    PT Time Calculation (min) 40 min    Activity Tolerance Patient tolerated treatment well    Behavior During Therapy WFL for tasks assessed/performed              Past Medical History:  Diagnosis Date   Arrhythmia    atrial fibrillation   Chronic renal insufficiency, stage III (moderate) (HCC)    Clotting disorder (HCC)    pulmonary embolism and deep vein thrombosis   Diabetes mellitus without complication (HCC)    Heart murmur    Hypertension    Sleep apnea    wears C-PAP at night   Thoracic aortic dissection [facility])    Past Surgical History:  Procedure Laterality Date   REPAIR OF ACUTE ASCENDING THORACIC AORTIC DISSECTION     Patient Active Problem List   Diagnosis Date Noted   Hammer toes, bilateral 05/13/2020   Hav (hallux abducto valgus), left 02/07/2020   Hav (hallux abducto valgus), right 02/07/2020   Family history of glaucoma 08/08/2019   Diabetes mellitus without complication (HCC) 05/10/2019   Bleeding nose 02/28/2019   Elevated serum immunoglobulin free light chain level 08/21/2018   Hx of dissecting abdominal aortic aneurysm repair 02/03/2018   Morbid obesity (HCC) 02/03/2018   Type 2 diabetes mellitus without retinopathy (HCC) 02/03/2018   Partial small bowel obstruction (HCC) 02/01/2018   Glaucoma suspect of both eyes 07/13/2017   Hyperopia with astigmatism and presbyopia, bilateral 07/13/2017   Long term current use of oral hypoglycemic drug 07/13/2017   Nuclear sclerotic cataract of both eyes 07/13/2017   Vitreous floaters, bilateral 07/13/2017    Stage 3 chronic kidney disease (HCC) 02/10/2016   Proteinuria 02/10/2016   Cholelithiasis with acute on chronic cholecystitis 01/14/2016   Malignant neoplasm of lung (HCC) 12/28/2015   Hematuria 11/09/2015   Hemospermia 11/09/2015   Mixed incontinence 08/17/2015   Benign prostatic hyperplasia with lower urinary tract symptoms 07/20/2015   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   Incontinence 07/20/2015   Obesity (BMI 30.0-34.9) 12/12/2014   Diabetes (HCC) 11/17/2014   Essential hypertension 11/17/2014   Hyperlipidemia 11/17/2014   Acute pulmonary embolism (HCC) 11/17/2014   Atrial fibrillation (HCC) 11/17/2014   Deep vein thrombosis (HCC) 11/17/2014   Obstructive sleep apnea (adult) (pediatric) 11/17/2014   Aortic dissection (HCC) 09/16/2014   Hx of ascending aorta replacement 09/16/2014    PCP: Alain Honey, MD  REFERRING PROVIDER: Ceasar Lund, MD  REFERRING DIAG: lumbar radiculopathy, low back pain, lumbar spondylosis  Rationale for Evaluation and Treatment: Rehabilitation  THERAPY DIAG:  Other low back pain  Pain in right thigh  Stiffness of right hip, not elsewhere classified  Stiffness of left hip, not elsewhere classified  ONSET DATE: 5 months ago (referral date 11/16/2023)  SUBJECTIVE:  SUBJECTIVE STATEMENT: 11/30/2023: Patient returned to the clinic describing no changes in chief complaint yet. He has been doing the exercises at home - they have not helped yet but also do not make things feel any worse.  Evaluation 11/23/2023: Patient reported that about 5 months ago he started developing pain along the R hip/thigh region. He did not realize it was coming from his low back but at the visit with the Orthopedist, radiographs were taken and they discovered some  stenosis. They trialed an injection and gabapentin, but neither helped with the pain. They were planning on trying another medication similar to gabapentin but the patient declined due to the potential side effects. The pain is felt the most at night and first thing in the AM. Then, as he moves around and loosens up, the pain gets better. Sleeping on the side seems to be the most comfortable. Lying on the back is not uncomfortable at first, but then will become painful as the night goes on.  PERTINENT HISTORY:  No prior history of pain in this region or other pertinent history per patient report.   PAIN:  Are you having pain? Yes: NPRS scale: 6-7/10 Pain location: R hip/thigh region  PRECAUTIONS: None  RED FLAGS: None   WEIGHT BEARING RESTRICTIONS: No  FALLS:  Has patient fallen in last 6 months? No  PATIENT GOALS: resolve chief complaint  OBJECTIVE:  Note: Objective measures were completed at Evaluation unless otherwise noted.  DIAGNOSTIC FINDINGS:  Radiographs were taken at an outside facility - patient reported findings of stenosis  PATIENT SURVEYS:  Modified Oswestry: 5/50, 10% disability   COGNITION: Overall cognitive status: Within functional limits for tasks assessed    MUSCLE LENGTH: -Hamstrings: Right 70 deg; Left 70 deg 70 deg at knee 90/90 position -Thomas test: Right lacking 5 deg; Left lacking 5 deg; tested in sidelying with rectus femoris at length  PALPATION: No significant pain with central PA glides of the lumbar spine  LUMBAR ROM:   AROM eval  Flexion Mid shin  Extension WNL  Right lateral flexion WNL  Left lateral flexion WNL  Right rotation   Left rotation    (Blank rows = not tested)  LOWER EXTREMITY ROM:     Passive  Right eval Left eval  Hip flexion 90 90  Hip extension 0 0  Hip abduction    Hip adduction    Hip internal rotation WNL WNL  Hip external rotation 25 25  Knee flexion    Knee extension    Ankle dorsiflexion    Ankle  plantarflexion    Ankle inversion    Ankle eversion     (Blank rows = not tested)  LOWER EXTREMITY MMT:    MMT Right eval Left eval  Hip flexion 5 5  Hip extension 5 5  Hip abduction 5 5  Hip adduction 5 5  Hip internal rotation    Hip external rotation    Knee flexion    Knee extension    Ankle dorsiflexion    Ankle plantarflexion    Ankle inversion    Ankle eversion     (Blank rows = not tested)  [facility] Adult PT Treatment:                                                DATE: 11/30/2023 Therapeutic Exercise: Supine: Manual single  knee to chest stretch, neutral, abducted, adducted, R and L Manual double knee to chest stretch with physio ball Sideying:  Manual rectus femoris stretch, R and L Manual Therapy: Sidelying: Long axis traction hip mobilization, R and L Seated, forward flexed:     Cephalad glides at lumbar segments and rib cage  [facility] Adult PT Treatment:                                                DATE: 11/23/2023 Therapeutic Exercise: Instructed home program (see below)                                                                                                                          PATIENT EDUCATION:  Education details: on current presentation, on HEP, on clinical outcomes score and POC Person educated: Patient Education method: Explanation, Demonstration, and Handouts Education comprehension: verbalized understanding   HOME EXERCISE PROGRAM: Access Code: [email or web address] URL: [email or web address] Date: 11/23/2023 Prepared by: Edmonia Caprio  Exercises - Seated Flexion Stretch  - 1 x daily - 7 x weekly - 3 sets - 10 reps - Standing Quadriceps Stretch  - 1 x daily - 7 x weekly - 3 sets - 10 reps - Supine Lower Trunk Rotation  - 1 x daily - 7 x weekly - 3 sets - 10 reps  ASSESSMENT:  CLINICAL IMPRESSION: 11/30/2023: Patient returned to first follow up describing no changes in symptoms yet. Continued focus on improving hip mobility,  particularly into flexion, as well as lumbar mobility into flexion. Also continued to stretch R and L rectus femoris to allow posterior rotation of the pelvis. Patient to monitor response to treatment between sessions. Physical therapy remains indicated.  Evaluation 11/23/2023: Patient is a 73 y.o. who was seen today for physical therapy evaluation and treatment for low back/R thigh pain. Stiffness of the lumbar spine and hips seems to be the biggest contributor to symptoms as the pain is only experienced during prolonged rest while sleeping. Strength is likely not an issue as patient tested strong and also does not have pain with activity - movement helps him loosen up and is relieving without any increase in pain with extended activity. Physical therapy will focus on improving the articular and soft tissue mobility of the lumbar spine (particularly into flexion) and hips to reduce symptoms and improve ability to sleep. Physical therapy is indicated.  OBJECTIVE IMPAIRMENTS: decreased mobility, decreased ROM, hypomobility, and pain.   ACTIVITY LIMITATIONS: sleeping  REHAB POTENTIAL: Good  CLINICAL DECISION MAKING: Stable/uncomplicated  EVALUATION COMPLEXITY: Low  GOALS: Goals reviewed with patient? yes  SHORT TERM GOALS: Target date: 12/21/2023   Patient will be independent in self management strategies to improve quality of life and functional outcomes. Baseline: New Program Goal status: INITIAL  2.  Patient will report at least 50% improvement in overall symptoms  and/or function to demonstrate improved functional mobility Baseline: 0% better Goal status: INITIAL  3.  Patient will report at minimum he is able to sleep >4 hours on the Modified Oswestry questionnaire. Baseline: <4 hours Goal status: INITIAL  LONG TERM GOALS: 01/18/2024   Patient will report at least 75% improvement in overall symptoms and/or function to demonstrate improved functional mobility Baseline: 0% better Goal  status: INITIAL  2.  Patient will report he is able to sleep > 6 hours on the Modified Oswestry questionnaire.  Baseline: <4 hours  Goal status: INITIAL  3.  Patient will demonstrate improve spinal mobility by being able to reach ankles with standing trunk flexion. Baseline: mid shin Goal status: INITIAL  PLAN:  PT FREQUENCY: 2x/week  PT DURATION: 8 weeks  PLANNED INTERVENTIONS: 97110-Therapeutic exercises, 97530- Therapeutic activity, 97112- Neuromuscular re-education, [phone]- Self Care, 60454- Manual therapy, [phone]- Gait training, [phone]- Orthotic Fit/training, [phone]- Canalith repositioning, [id number]- Aquatic Therapy, 97014- Electrical stimulation (unattended), [phone]- Ionotophoresis 4mg /ml Dexamethasone, Patient/Family education, Balance training, Stair training, Taping, Dry Needling, Joint mobilization, Joint manipulation, Spinal manipulation, Spinal mobilization, Cryotherapy, and Moist heat   PLAN FOR NEXT SESSION: lumbar mobility exercises (particularly into flexion), hip mobility exercises, soft tissue stretching, joint/soft tissue mobilization to the lumbar and hip regions as indicated  Clelia Schaumann, PT 11/30/2023, 11:55 AM

## 2023-12-04 ENCOUNTER — Ambulatory Visit: Payer: Medicare Other

## 2023-12-04 DIAGNOSIS — M25651 Stiffness of right hip, not elsewhere classified: Secondary | ICD-10-CM

## 2023-12-04 DIAGNOSIS — M5459 Other low back pain: Secondary | ICD-10-CM

## 2023-12-04 DIAGNOSIS — M25652 Stiffness of left hip, not elsewhere classified: Secondary | ICD-10-CM

## 2023-12-04 DIAGNOSIS — M79651 Pain in right thigh: Secondary | ICD-10-CM

## 2023-12-04 NOTE — Therapy (Signed)
 OUTPATIENT PHYSICAL THERAPY THORACOLUMBAR TREATMENT   Patient Name: Benjamin Blair MRN: [id number] DOB:[birth year], 73 y.o., male Today's Date: 12/04/2023  END OF SESSION:  PT End of Session - 12/04/23 1105     Visit Number 3    Number of Visits 16    Date for PT Re-Evaluation 01/18/24    Authorization Type Medicare & AARP    Progress Note Due on Visit 10    PT Start Time 1105    PT Stop Time 1143    PT Time Calculation (min) 38 min    Activity Tolerance Patient tolerated treatment well    Behavior During Therapy WFL for tasks assessed/performed               Past Medical History:  Diagnosis Date   Arrhythmia    atrial fibrillation   Chronic renal insufficiency, stage III (moderate) (HCC)    Clotting disorder (HCC)    pulmonary embolism and deep vein thrombosis   Diabetes mellitus without complication (HCC)    Heart murmur    Hypertension    Sleep apnea    wears C-PAP at night   Thoracic aortic dissection [facility])    Past Surgical History:  Procedure Laterality Date   REPAIR OF ACUTE ASCENDING THORACIC AORTIC DISSECTION     Patient Active Problem List   Diagnosis Date Noted   Hammer toes, bilateral 05/13/2020   Hav (hallux abducto valgus), left 02/07/2020   Hav (hallux abducto valgus), right 02/07/2020   Family history of glaucoma 08/08/2019   Diabetes mellitus without complication (HCC) 05/10/2019   Bleeding nose 02/28/2019   Elevated serum immunoglobulin free light chain level 08/21/2018   Hx of dissecting abdominal aortic aneurysm repair 02/03/2018   Morbid obesity (HCC) 02/03/2018   Type 2 diabetes mellitus without retinopathy (HCC) 02/03/2018   Partial small bowel obstruction (HCC) 02/01/2018   Glaucoma suspect of both eyes 07/13/2017   Hyperopia with astigmatism and presbyopia, bilateral 07/13/2017   Long term current use of oral hypoglycemic drug 07/13/2017   Nuclear sclerotic cataract of both eyes 07/13/2017   Vitreous floaters, bilateral 07/13/2017    Stage 3 chronic kidney disease (HCC) 02/10/2016   Proteinuria 02/10/2016   Cholelithiasis with acute on chronic cholecystitis 01/14/2016   Malignant neoplasm of lung (HCC) 12/28/2015   Hematuria 11/09/2015   Hemospermia 11/09/2015   Mixed incontinence 08/17/2015   Benign prostatic hyperplasia with lower urinary tract symptoms 07/20/2015   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   Incontinence 07/20/2015   Obesity (BMI 30.0-34.9) 12/12/2014   Diabetes (HCC) 11/17/2014   Essential hypertension 11/17/2014   Hyperlipidemia 11/17/2014   Acute pulmonary embolism (HCC) 11/17/2014   Atrial fibrillation (HCC) 11/17/2014   Deep vein thrombosis (HCC) 11/17/2014   Obstructive sleep apnea (adult) (pediatric) 11/17/2014   Aortic dissection (HCC) 09/16/2014   Hx of ascending aorta replacement 09/16/2014    PCP: Alain Honey, MD  REFERRING PROVIDER: Ceasar Lund, MD  REFERRING DIAG: lumbar radiculopathy, low back pain, lumbar spondylosis  Rationale for Evaluation and Treatment: Rehabilitation  THERAPY DIAG:  Other low back pain  Pain in right thigh  Stiffness of right hip, not elsewhere classified  Stiffness of left hip, not elsewhere classified  ONSET DATE: 5 months ago (referral date 11/16/2023)  SUBJECTIVE:  SUBJECTIVE STATEMENT: "Pain is still there. About the same."   Evaluation 11/23/2023: Patient reported that about 5 months ago he started developing pain along the R hip/thigh region. He did not realize it was coming from his low back but at the visit with the Orthopedist, radiographs were taken and they discovered some stenosis. They trialed an injection and gabapentin, but neither helped with the pain. They were planning on trying another medication similar to gabapentin but  the patient declined due to the potential side effects. The pain is felt the most at night and first thing in the AM. Then, as he moves around and loosens up, the pain gets better. Sleeping on the side seems to be the most comfortable. Lying on the back is not uncomfortable at first, but then will become painful as the night goes on.  PERTINENT HISTORY:  No prior history of pain in this region or other pertinent history per patient report.   PAIN:  Are you having pain? Yes: NPRS scale: 3 Pain location: right anterior thigh Pain description: ache Aggravating factors: first thing in the morning and in the evening Relieving factors: movement    PRECAUTIONS: None  RED FLAGS: None   WEIGHT BEARING RESTRICTIONS: No  FALLS:  Has patient fallen in last 6 months? No  PATIENT GOALS: resolve chief complaint  OBJECTIVE:  Note: Objective measures were completed at Evaluation unless otherwise noted.  DIAGNOSTIC FINDINGS:  Radiographs were taken at an outside facility - patient reported findings of stenosis  PATIENT SURVEYS:  Modified Oswestry: 5/50, 10% disability   COGNITION: Overall cognitive status: Within functional limits for tasks assessed    MUSCLE LENGTH: -Hamstrings: Right 70 deg; Left 70 deg 70 deg at knee 90/90 position -Thomas test: Right lacking 5 deg; Left lacking 5 deg; tested in sidelying with rectus femoris at length  PALPATION: No significant pain with central PA glides of the lumbar spine  LUMBAR ROM:   AROM eval  Flexion Mid shin  Extension WNL  Right lateral flexion WNL  Left lateral flexion WNL  Right rotation   Left rotation    (Blank rows = not tested)  LOWER EXTREMITY ROM:     Passive  Right eval Left eval  Hip flexion 90 90  Hip extension 0 0  Hip abduction    Hip adduction    Hip internal rotation WNL WNL  Hip external rotation 25 25  Knee flexion    Knee extension    Ankle dorsiflexion    Ankle plantarflexion    Ankle inversion     Ankle eversion     (Blank rows = not tested)  LOWER EXTREMITY MMT:    MMT Right eval Left eval  Hip flexion 5 5  Hip extension 5 5  Hip abduction 5 5  Hip adduction 5 5  Hip internal rotation    Hip external rotation    Knee flexion    Knee extension    Ankle dorsiflexion    Ankle plantarflexion    Ankle inversion    Ankle eversion     (Blank rows = not tested) [facility] Adult PT Treatment:                                                DATE: 12/04/23 Therapeutic Exercise: Modified butterfly stretch x 30 sec Figure 4 stretch x 30 sec each  SKTC x 10  3 way physioball rollout x 2 minutes  Quadruped rocking x 10  DTKC x 10  Updated HEP  Manual Therapy: Rt hip mobilizations inferior, posterior, and lateral grade II-III Rt LAD Passive Rt hip flexion and ER/IR ROM  Passive Rt hamstring stretch  Neuromuscular re-ed: Supine posterior pelvic tilt 2 x 10    [facility] Adult PT Treatment:                                                DATE: 11/30/2023 Therapeutic Exercise: Supine: Manual single knee to chest stretch, neutral, abducted, adducted, R and L Manual double knee to chest stretch with physio ball Sideying:  Manual rectus femoris stretch, R and L Manual Therapy: Sidelying: Long axis traction hip mobilization, R and L Seated, forward flexed:     Cephalad glides at lumbar segments and rib cage  [facility] Adult PT Treatment:                                                DATE: 11/23/2023 Therapeutic Exercise: Instructed home program (see below)                                                                                                                          PATIENT EDUCATION:  Education details: HEP update  Person educated: Patient Education method: Explanation, Demonstration, and Handouts Education comprehension: verbalized understanding   HOME EXERCISE PROGRAM: Access Code: [email or web address] URL: [email or web address] Date: 12/04/2023 Prepared by: Letitia Libra  Exercises - Seated Flexion Stretch  - 1 x daily - 7 x weekly - 3 sets - 10 reps - Standing Quadriceps Stretch  - 1 x daily - 7 x weekly - 3 sets - 30 sec  hold - Supine Lower Trunk Rotation  - 1 x daily - 7 x weekly - 3 sets - 10 reps - Hooklying Single Knee to Chest Stretch with Towel  - 1 x daily - 7 x weekly - 1 sets - 10 reps - 5 sec  hold - Supine Figure 4 Piriformis Stretch  - 1 x daily - 7 x weekly - 3 sets - 30  hold - Quadruped Rocking Backward  - 1 x daily - 7 x weekly - 1 sets - 10 reps - 5 sec  hold  ASSESSMENT:  CLINICAL IMPRESSION: Patient arrived with mild Rt thigh pain. Responds well to manual techniques aimed at improving Rt hip mobility with abolishment of his pain. Focused on hip mobility and introduced core stabilization without reoccurrence of pain. He is challenged initially with proper engagement of core with posterior pelvic tilts, but with tactile and verbal cues he is able to properly perform.  HEP was updated  to include further mobility exercises with recommendation to complete some exercises in AM and some in PM to assist in addressing his pain that tends to be aggravated in the morning/evening.    Evaluation 11/23/2023: Patient is a 73 y.o. who was seen today for physical therapy evaluation and treatment for low back/R thigh pain. Stiffness of the lumbar spine and hips seems to be the biggest contributor to symptoms as the pain is only experienced during prolonged rest while sleeping. Strength is likely not an issue as patient tested strong and also does not have pain with activity - movement helps him loosen up and is relieving without any increase in pain with extended activity. Physical therapy will focus on improving the articular and soft tissue mobility of the lumbar spine (particularly into flexion) and hips to reduce symptoms and improve ability to sleep. Physical therapy is indicated.  OBJECTIVE IMPAIRMENTS: decreased mobility, decreased ROM, hypomobility,  and pain.   ACTIVITY LIMITATIONS: sleeping  REHAB POTENTIAL: Good  CLINICAL DECISION MAKING: Stable/uncomplicated  EVALUATION COMPLEXITY: Low  GOALS: Goals reviewed with patient? yes  SHORT TERM GOALS: Target date: 12/21/2023   Patient will be independent in self management strategies to improve quality of life and functional outcomes. Baseline: New Program Goal status: INITIAL  2.  Patient will report at least 50% improvement in overall symptoms and/or function to demonstrate improved functional mobility Baseline: 0% better Goal status: INITIAL  3.  Patient will report at minimum he is able to sleep >4 hours on the Modified Oswestry questionnaire. Baseline: <4 hours Goal status: INITIAL  LONG TERM GOALS: 01/18/2024   Patient will report at least 75% improvement in overall symptoms and/or function to demonstrate improved functional mobility Baseline: 0% better Goal status: INITIAL  2.  Patient will report he is able to sleep > 6 hours on the Modified Oswestry questionnaire.  Baseline: <4 hours  Goal status: INITIAL  3.  Patient will demonstrate improve spinal mobility by being able to reach ankles with standing trunk flexion. Baseline: mid shin Goal status: INITIAL  PLAN:  PT FREQUENCY: 2x/week  PT DURATION: 8 weeks  PLANNED INTERVENTIONS: 97110-Therapeutic exercises, 97530- Therapeutic activity, [id number]- Neuromuscular re-education, 97535- Self Care, [id number]- Manual therapy, [phone]- Gait training, [phone]- Orthotic Fit/training, [phone]- Canalith repositioning, [id number]- Aquatic Therapy, 97014- Electrical stimulation (unattended), [phone]- Ionotophoresis 4mg /ml Dexamethasone, Patient/Family education, Balance training, Stair training, Taping, Dry Needling, Joint mobilization, Joint manipulation, Spinal manipulation, Spinal mobilization, Cryotherapy, and Moist heat   PLAN FOR NEXT SESSION: lumbar mobility exercises (particularly into flexion), hip mobility exercises, soft tissue  stretching, joint/soft tissue mobilization to the lumbar and hip regions as indicated  Letitia Libra, PT, DPT, ATC 12/04/23 11:44 AM

## 2023-12-07 ENCOUNTER — Ambulatory Visit: Payer: Medicare Other | Admitting: Physical Therapy

## 2023-12-11 ENCOUNTER — Ambulatory Visit: Payer: Medicare Other | Admitting: Physical Therapy

## 2023-12-11 ENCOUNTER — Encounter: Payer: Self-pay | Admitting: Physical Therapy

## 2023-12-11 DIAGNOSIS — M25652 Stiffness of left hip, not elsewhere classified: Secondary | ICD-10-CM

## 2023-12-11 DIAGNOSIS — M79651 Pain in right thigh: Secondary | ICD-10-CM

## 2023-12-11 DIAGNOSIS — M5459 Other low back pain: Secondary | ICD-10-CM | POA: Diagnosis not present

## 2023-12-11 DIAGNOSIS — M25651 Stiffness of right hip, not elsewhere classified: Secondary | ICD-10-CM

## 2023-12-11 NOTE — Therapy (Signed)
 OUTPATIENT PHYSICAL THERAPY THORACOLUMBAR TREATMENT   Patient Name: Benjamin Blair MRN: [id number] DOB:19-Oct-1950, 73 y.o., male Today's Date: 12/11/2023  END OF SESSION:  PT End of Session - 12/11/23 1102     Visit Number 4    Number of Visits 16    Date for PT Re-Evaluation 01/18/24    Authorization Type Medicare & [employer]    Progress Note Due on Visit 10    PT Start Time 1102    PT Stop Time 1140    PT Time Calculation (min) 38 min    Activity Tolerance Patient tolerated treatment well    Behavior During Therapy WFL for tasks assessed/performed                Past Medical History:  Diagnosis Date   Arrhythmia    atrial fibrillation   Chronic renal insufficiency, stage III (moderate) (HCC)    Clotting disorder (HCC)    pulmonary embolism and deep vein thrombosis   Diabetes mellitus without complication (HCC)    Heart murmur    Hypertension    Sleep apnea    wears C-PAP at night   Thoracic aortic dissection [facility])    Past Surgical History:  Procedure Laterality Date   REPAIR OF ACUTE ASCENDING THORACIC AORTIC DISSECTION     Patient Active Problem List   Diagnosis Date Noted   Hammer toes, bilateral 05/13/2020   Hav (hallux abducto valgus), left 02/07/2020   Hav (hallux abducto valgus), right 02/07/2020   Family history of glaucoma 08/08/2019   Diabetes mellitus without complication (HCC) 05/10/2019   Bleeding nose 02/28/2019   Elevated serum immunoglobulin free light chain level 08/21/2018   Hx of dissecting abdominal aortic aneurysm repair 02/03/2018   Morbid obesity (HCC) 02/03/2018   Type 2 diabetes mellitus without retinopathy (HCC) 02/03/2018   Partial small bowel obstruction (HCC) 02/01/2018   Glaucoma suspect of both eyes 07/13/2017   Hyperopia with astigmatism and presbyopia, bilateral 07/13/2017   Long term current use of oral hypoglycemic drug 07/13/2017   Nuclear sclerotic cataract of both eyes 07/13/2017   Vitreous floaters, bilateral  07/13/2017   Stage 3 chronic kidney disease (HCC) 02/10/2016   Proteinuria 02/10/2016   Cholelithiasis with acute on chronic cholecystitis 01/14/2016   Malignant neoplasm of lung (HCC) 12/28/2015   Hematuria 11/09/2015   Hemospermia 11/09/2015   Mixed incontinence 08/17/2015   Benign prostatic hyperplasia with lower urinary tract symptoms 07/20/2015   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   Incontinence 07/20/2015   Obesity (BMI 30.0-34.9) 12/12/2014   Diabetes (HCC) 11/17/2014   Essential hypertension 11/17/2014   Hyperlipidemia 11/17/2014   Acute pulmonary embolism (HCC) 11/17/2014   Atrial fibrillation (HCC) 11/17/2014   Deep vein thrombosis (HCC) 11/17/2014   Obstructive sleep apnea (adult) (pediatric) 11/17/2014   Aortic dissection (HCC) 09/16/2014   Hx of ascending aorta replacement 09/16/2014    PCP: Alain Honey, MD  REFERRING PROVIDER: Ceasar Lund, MD  REFERRING DIAG: lumbar radiculopathy, low back pain, lumbar spondylosis  Rationale for Evaluation and Treatment: Rehabilitation  THERAPY DIAG:  Other low back pain  Pain in right thigh  Stiffness of right hip, not elsewhere classified  Stiffness of left hip, not elsewhere classified  ONSET DATE: 5 months ago (referral date 11/16/2023)  SUBJECTIVE:  SUBJECTIVE STATEMENT: 12/11/2023 Has been stretching in the mornings with good relief. Felt good after last session. No issues.   Evaluation 11/23/2023: Patient reported that about 5 months ago he started developing pain along the R hip/thigh region. He did not realize it was coming from his low back but at the visit with the Orthopedist, radiographs were taken and they discovered some stenosis. They trialed an injection and gabapentin, but neither helped with the  pain. They were planning on trying another medication similar to gabapentin but the patient declined due to the potential side effects. The pain is felt the most at night and first thing in the AM. Then, as he moves around and loosens up, the pain gets better. Sleeping on the side seems to be the most comfortable. Lying on the back is not uncomfortable at first, but then will become painful as the night goes on.  PERTINENT HISTORY:  No prior history of pain in this region or other pertinent history per patient report.   PAIN:  Are you having pain? Yes: NPRS scale: 0/10 Pain location: right anterior thigh Pain description: ache Aggravating factors: first thing in the morning and in the evening Relieving factors: movement    PRECAUTIONS: None  RED FLAGS: None   WEIGHT BEARING RESTRICTIONS: No  FALLS:  Has patient fallen in last 6 months? No  PATIENT GOALS: resolve chief complaint  OBJECTIVE:  Note: Objective measures were completed at Evaluation unless otherwise noted.  DIAGNOSTIC FINDINGS:  Radiographs were taken at an outside facility - patient reported findings of stenosis  PATIENT SURVEYS:  Modified Oswestry: 5/50, 10% disability   COGNITION: Overall cognitive status: Within functional limits for tasks assessed    MUSCLE LENGTH: -Hamstrings: Right 70 deg; Left 70 deg 70 deg at knee 90/90 position -Thomas test: Right lacking 5 deg; Left lacking 5 deg; tested in sidelying with rectus femoris at length  PALPATION: No significant pain with central PA glides of the lumbar spine  LUMBAR ROM:   AROM eval  Flexion Mid shin  Extension WNL  Right lateral flexion WNL  Left lateral flexion WNL  Right rotation   Left rotation    (Blank rows = not tested)  LOWER EXTREMITY ROM:     Passive  Right eval Left eval  Hip flexion 90 90  Hip extension 0 0  Hip abduction    Hip adduction    Hip internal rotation WNL WNL  Hip external rotation 25 25  Knee flexion    Knee  extension    Ankle dorsiflexion    Ankle plantarflexion    Ankle inversion    Ankle eversion     (Blank rows = not tested)  LOWER EXTREMITY MMT:    MMT Right eval Left eval  Hip flexion 5 5  Hip extension 5 5  Hip abduction 5 5  Hip adduction 5 5  Hip internal rotation    Hip external rotation    Knee flexion    Knee extension    Ankle dorsiflexion    Ankle plantarflexion    Ankle inversion    Ankle eversion     (Blank rows = not tested)   [facility] Adult PT Treatment:                                                DATE: 12/11/23 Therapeutic Exercise: [facility]  3x30sec BIL Figure four stretch 3x30sec BIL LTR x8 BIL Standing hip drop + hike off 4 inch step w/ UE support 2x5 BIL cues to reduce compensations at knee Seated swiss ball flexion 2x10 cues for comfortable ROM  Neuromuscular re-ed: Bridge + ball squeeze x8 cues for breath control and core activation Bridge w/o ball x8 Bridge + red band at knee x8 cues for hip activation Seated hip flexion iso w/ ball 2x10 BIL cues for posture, avoiding compensations at trunk and foot    [facility] Adult PT Treatment:                                                DATE: 12/04/23 Therapeutic Exercise: Modified butterfly stretch x 30 sec Figure 4 stretch x 30 sec each  SKTC x 10  3 way physioball rollout x 2 minutes  Quadruped rocking x 10  DTKC x 10  Updated HEP  Manual Therapy: Rt hip mobilizations inferior, posterior, and lateral grade II-III Rt LAD Passive Rt hip flexion and ER/IR ROM  Passive Rt hamstring stretch  Neuromuscular re-ed: Supine posterior pelvic tilt 2 x 10    [facility] Adult PT Treatment:                                                DATE: 11/30/2023 Therapeutic Exercise: Supine: Manual single knee to chest stretch, neutral, abducted, adducted, R and L Manual double knee to chest stretch with physio ball Sideying:  Manual rectus femoris stretch, R and L Manual Therapy: Sidelying: Long axis traction hip  mobilization, R and L Seated, forward flexed:     Cephalad glides at lumbar segments and rib cage  [facility] Adult PT Treatment:                                                DATE: 11/23/2023 Therapeutic Exercise: Instructed home program (see below)                                                                                                                          PATIENT EDUCATION:  Education details: HEP, rationale for interventions Person educated: Patient Education method: Explanation, Demonstration, and Handouts Education comprehension: verbalized understanding   HOME EXERCISE PROGRAM: Access Code: [email or web address] URL: [email or web address] Date: 12/04/2023 Prepared by: Letitia Libra  Exercises - Seated Flexion Stretch  - 1 x daily - 7 x weekly - 3 sets - 10 reps - Standing Quadriceps Stretch  - 1 x daily - 7 x weekly - 3 sets - 30 sec  hold - Supine Lower Trunk Rotation  -  1 x daily - 7 x weekly - 3 sets - 10 reps - Hooklying Single Knee to Chest Stretch with Towel  - 1 x daily - 7 x weekly - 1 sets - 10 reps - 5 sec  hold - Supine Figure 4 Piriformis Stretch  - 1 x daily - 7 x weekly - 3 sets - 30  hold - Quadruped Rocking Backward  - 1 x daily - 7 x weekly - 1 sets - 10 reps - 5 sec  hold  ASSESSMENT:  CLINICAL IMPRESSION: 12/11/2023 Pt arrives w/o pain, notes stretches in morning have been helpful. Weaning manual today given lack of pain on arrival, instead expanding volume for stretches to maximize tissue extensibility and increasing emphasis on core/hip stability. Good tolerance, requires cues as above. No increase in pain and no adverse events, endorses muscular fatigue as expected on departure. Recommend continuing along current POC in order to address relevant deficits and improve functional tolerance. Pt departs today's session in no acute distress, all voiced questions/concerns addressed appropriately from PT perspective.    Evaluation 11/23/2023: Patient is a  73 y.o. who was seen today for physical therapy evaluation and treatment for low back/R thigh pain. Stiffness of the lumbar spine and hips seems to be the biggest contributor to symptoms as the pain is only experienced during prolonged rest while sleeping. Strength is likely not an issue as patient tested strong and also does not have pain with activity - movement helps him loosen up and is relieving without any increase in pain with extended activity. Physical therapy will focus on improving the articular and soft tissue mobility of the lumbar spine (particularly into flexion) and hips to reduce symptoms and improve ability to sleep. Physical therapy is indicated.  OBJECTIVE IMPAIRMENTS: decreased mobility, decreased ROM, hypomobility, and pain.   ACTIVITY LIMITATIONS: sleeping  REHAB POTENTIAL: Good  CLINICAL DECISION MAKING: Stable/uncomplicated  EVALUATION COMPLEXITY: Low  GOALS: Goals reviewed with patient? yes  SHORT TERM GOALS: Target date: 12/21/2023   Patient will be independent in self management strategies to improve quality of life and functional outcomes. Baseline: New Program Goal status: INITIAL  2.  Patient will report at least 50% improvement in overall symptoms and/or function to demonstrate improved functional mobility Baseline: 0% better Goal status: INITIAL  3.  Patient will report at minimum he is able to sleep >4 hours on the Modified Oswestry questionnaire. Baseline: <4 hours Goal status: INITIAL  LONG TERM GOALS: 01/18/2024   Patient will report at least 75% improvement in overall symptoms and/or function to demonstrate improved functional mobility Baseline: 0% better Goal status: INITIAL  2.  Patient will report he is able to sleep > 6 hours on the Modified Oswestry questionnaire.  Baseline: <4 hours  Goal status: INITIAL  3.  Patient will demonstrate improve spinal mobility by being able to reach ankles with standing trunk flexion. Baseline: mid  shin Goal status: INITIAL  PLAN:  PT FREQUENCY: 2x/week  PT DURATION: 8 weeks  PLANNED INTERVENTIONS: 97110-Therapeutic exercises, 97530- Therapeutic activity, [id number]- Neuromuscular re-education, 97535- Self Care, [id number]- Manual therapy, [phone]- Gait training, [phone]- Orthotic Fit/training, [phone]- Canalith repositioning, [id number]- Aquatic Therapy, [id number]- Electrical stimulation (unattended), [phone]- Ionotophoresis 4mg /ml Dexamethasone, Patient/Family education, Balance training, Stair training, Taping, Dry Needling, Joint mobilization, Joint manipulation, Spinal manipulation, Spinal mobilization, Cryotherapy, and Moist heat   PLAN FOR NEXT SESSION: manual/symptom modification strategies PRN as indicated. Continue with lumbar/hip mobility work as appropriate, pt with tendency to prefer flexion. Continue with core/hip stability  work as indicated.   Ashley Murrain PT, DPT 12/11/2023 11:44 AM

## 2023-12-14 ENCOUNTER — Ambulatory Visit: Payer: Medicare Other

## 2023-12-14 DIAGNOSIS — M5459 Other low back pain: Secondary | ICD-10-CM

## 2023-12-14 DIAGNOSIS — M25652 Stiffness of left hip, not elsewhere classified: Secondary | ICD-10-CM

## 2023-12-14 DIAGNOSIS — M25651 Stiffness of right hip, not elsewhere classified: Secondary | ICD-10-CM

## 2023-12-14 DIAGNOSIS — M79651 Pain in right thigh: Secondary | ICD-10-CM

## 2023-12-14 NOTE — Therapy (Signed)
 OUTPATIENT PHYSICAL THERAPY THORACOLUMBAR TREATMENT   Patient Name: Benjamin Blair MRN: [id number] DOB:Feb 18, 1951, 73 y.o., male Today's Date: 12/14/2023  END OF SESSION:  PT End of Session - 12/14/23 1014     Visit Number 5    Number of Visits 16    Date for PT Re-Evaluation 01/18/24    Authorization Type Medicare & [employer]    Progress Note Due on Visit 10    PT Start Time 1015    PT Stop Time 1057    PT Time Calculation (min) 42 min    Activity Tolerance Patient tolerated treatment well    Behavior During Therapy WFL for tasks assessed/performed            Past Medical History:  Diagnosis Date   Arrhythmia    atrial fibrillation   Chronic renal insufficiency, stage III (moderate) (HCC)    Clotting disorder (HCC)    pulmonary embolism and deep vein thrombosis   Diabetes mellitus without complication (HCC)    Heart murmur    Hypertension    Sleep apnea    wears C-PAP at night   Thoracic aortic dissection [facility])    Past Surgical History:  Procedure Laterality Date   REPAIR OF ACUTE ASCENDING THORACIC AORTIC DISSECTION     Patient Active Problem List   Diagnosis Date Noted   Hammer toes, bilateral 05/13/2020   Hav (hallux abducto valgus), left 02/07/2020   Hav (hallux abducto valgus), right 02/07/2020   Family history of glaucoma 08/08/2019   Diabetes mellitus without complication (HCC) 05/10/2019   Bleeding nose 02/28/2019   Elevated serum immunoglobulin free light chain level 08/21/2018   Hx of dissecting abdominal aortic aneurysm repair 02/03/2018   Morbid obesity (HCC) 02/03/2018   Type 2 diabetes mellitus without retinopathy (HCC) 02/03/2018   Partial small bowel obstruction (HCC) 02/01/2018   Glaucoma suspect of both eyes 07/13/2017   Hyperopia with astigmatism and presbyopia, bilateral 07/13/2017   Long term current use of oral hypoglycemic drug 07/13/2017   Nuclear sclerotic cataract of both eyes 07/13/2017   Vitreous floaters, bilateral 07/13/2017    Stage 3 chronic kidney disease (HCC) 02/10/2016   Proteinuria 02/10/2016   Cholelithiasis with acute on chronic cholecystitis 01/14/2016   Malignant neoplasm of lung (HCC) 12/28/2015   Hematuria 11/09/2015   Hemospermia 11/09/2015   Mixed incontinence 08/17/2015   Benign prostatic hyperplasia with lower urinary tract symptoms 07/20/2015   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   Incontinence 07/20/2015   Obesity (BMI 30.0-34.9) 12/12/2014   Diabetes (HCC) 11/17/2014   Essential hypertension 11/17/2014   Hyperlipidemia 11/17/2014   Acute pulmonary embolism (HCC) 11/17/2014   Atrial fibrillation (HCC) 11/17/2014   Deep vein thrombosis (HCC) 11/17/2014   Obstructive sleep apnea (adult) (pediatric) 11/17/2014   Aortic dissection (HCC) 09/16/2014   Hx of ascending aorta replacement 09/16/2014    PCP: Alain Honey, MD  REFERRING PROVIDER: Ceasar Lund, MD  REFERRING DIAG: lumbar radiculopathy, low back pain, lumbar spondylosis  Rationale for Evaluation and Treatment: Rehabilitation  THERAPY DIAG:  Other low back pain  Pain in right thigh  Stiffness of right hip, not elsewhere classified  Stiffness of left hip, not elsewhere classified  ONSET DATE: 5 months ago (referral date 11/16/2023)  SUBJECTIVE:  SUBJECTIVE STATEMENT: Patient reports he is feeling much better with back.hip pain, states he has moderate pain first thing in the mornings that eases off with movement.  Evaluation 11/23/2023: Patient reported that about 5 months ago he started developing pain along the R hip/thigh region. He did not realize it was coming from his low back but at the visit with the Orthopedist, radiographs were taken and they discovered some stenosis. They trialed an injection and gabapentin,  but neither helped with the pain. They were planning on trying another medication similar to gabapentin but the patient declined due to the potential side effects. The pain is felt the most at night and first thing in the AM. Then, as he moves around and loosens up, the pain gets better. Sleeping on the side seems to be the most comfortable. Lying on the back is not uncomfortable at first, but then will become painful as the night goes on.  PERTINENT HISTORY:  No prior history of pain in this region or other pertinent history per patient report.   PAIN:  Are you having pain? Yes: NPRS scale: 0/10 Pain location: right anterior thigh Pain description: ache Aggravating factors: first thing in the morning and in the evening Relieving factors: movement    PRECAUTIONS: None  RED FLAGS: None   WEIGHT BEARING RESTRICTIONS: No  FALLS:  Has patient fallen in last 6 months? No  PATIENT GOALS: resolve chief complaint  OBJECTIVE:  Note: Objective measures were completed at Evaluation unless otherwise noted.  DIAGNOSTIC FINDINGS:  Radiographs were taken at an outside facility - patient reported findings of stenosis  PATIENT SURVEYS:  Modified Oswestry: 5/50, 10% disability   COGNITION: Overall cognitive status: Within functional limits for tasks assessed    MUSCLE LENGTH: -Hamstrings: Right 70 deg; Left 70 deg 70 deg at knee 90/90 position -Thomas test: Right lacking 5 deg; Left lacking 5 deg; tested in sidelying with rectus femoris at length  PALPATION: No significant pain with central PA glides of the lumbar spine  LUMBAR ROM:   AROM eval  Flexion Mid shin  Extension WNL  Right lateral flexion WNL  Left lateral flexion WNL  Right rotation   Left rotation    (Blank rows = not tested)  LOWER EXTREMITY ROM:     Passive  Right eval Left eval  Hip flexion 90 90  Hip extension 0 0  Hip abduction    Hip adduction    Hip internal rotation WNL WNL  Hip external rotation 25  25  Knee flexion    Knee extension    Ankle dorsiflexion    Ankle plantarflexion    Ankle inversion    Ankle eversion     (Blank rows = not tested)  LOWER EXTREMITY MMT:    MMT Right eval Left eval  Hip flexion 5 5  Hip extension 5 5  Hip abduction 5 5  Hip adduction 5 5  Hip internal rotation    Hip external rotation    Knee flexion    Knee extension    Ankle dorsiflexion    Ankle plantarflexion    Ankle inversion    Ankle eversion     (Blank rows = not tested)   [facility] Adult PT Treatment:  DATE: 12/14/2023 Therapeutic Exercise: SKTC  Figure 4 LTR  Thomas stretch w/strap (R) 3x30" Prone: Resisted HS curls: GTB x10, RTB x10 Straight leg hip extension x10 Bent knee hip extension x10 Quad stretch w/strap 3x30" Seated HS stretch Seated hip flexor stretch x30" Dead bug progression: modified --> isometric hold --> full dead bug Standing: Resisted hip flexion + RTB (unilateral march) Resisted hip abd + GTB Resisted 3-way hip + GTB   Neuromuscular re-ed: Hip isometrics/stabilization: Bridge + hip abd iso with black TB 2x10 Bent knee fall out + black TB x15 (B)     [facility] Adult PT Treatment:                                                DATE: 12/11/23 Therapeutic Exercise: SKTC 3x30sec BIL Figure four stretch 3x30sec BIL LTR x8 BIL Standing hip drop + hike off 4 inch step w/ UE support 2x5 BIL cues to reduce compensations at knee Seated swiss ball flexion 2x10 cues for comfortable ROM  Neuromuscular re-ed: Bridge + ball squeeze x8 cues for breath control and core activation Bridge w/o ball x8 Bridge + red band at knee x8 cues for hip activation Seated hip flexion iso w/ ball 2x10 BIL cues for posture, avoiding compensations at trunk and foot    [facility] Adult PT Treatment:                                                DATE: 12/04/23 Therapeutic Exercise: Modified butterfly stretch x 30 sec Figure 4 stretch x 30 sec  each  SKTC x 10  3 way physioball rollout x 2 minutes  Quadruped rocking x 10  DTKC x 10  Updated HEP  Manual Therapy: Rt hip mobilizations inferior, posterior, and lateral grade II-III Rt LAD Passive Rt hip flexion and ER/IR ROM  Passive Rt hamstring stretch  Neuromuscular re-ed: Supine posterior pelvic tilt 2 x 10    [facility] Adult PT Treatment:                                                DATE: 11/30/2023 Therapeutic Exercise: Supine: Manual single knee to chest stretch, neutral, abducted, adducted, R and L Manual double knee to chest stretch with physio ball Sideying:  Manual rectus femoris stretch, R and L Manual Therapy: Sidelying: Long axis traction hip mobilization, R and L Seated, forward flexed:     Cephalad glides at lumbar segments and rib cage                                                                                                   PATIENT EDUCATION:  Education details: HEP, rationale for interventions Person educated:  Patient Education method: Explanation, Demonstration, and Handouts Education comprehension: verbalized understanding   HOME EXERCISE PROGRAM: Access Code: [email or web address] URL: [email or web address] Date: 12/04/2023 Prepared by: Letitia Libra  Exercises - Seated Flexion Stretch  - 1 x daily - 7 x weekly - 3 sets - 10 reps - Standing Quadriceps Stretch  - 1 x daily - 7 x weekly - 3 sets - 30 sec  hold - Supine Lower Trunk Rotation  - 1 x daily - 7 x weekly - 3 sets - 10 reps - Hooklying Single Knee to Chest Stretch with Towel  - 1 x daily - 7 x weekly - 1 sets - 10 reps - 5 sec  hold - Supine Figure 4 Piriformis Stretch  - 1 x daily - 7 x weekly - 3 sets - 30  hold - Quadruped Rocking Backward  - 1 x daily - 7 x weekly - 1 sets - 10 reps - 5 sec  hold  ASSESSMENT:  CLINICAL IMPRESSION: Dead bug progression added to progress core strength and trunk stability. Hip strengthening progressed with resistance in prone and standing  exercises. Occasional cueing provided to improve body mechanics and pelvic stabilization. Increased quad fatigue noted with leg in 90/90 isometric hold.  Evaluation 11/23/2023: Patient is a 73 y.o. who was seen today for physical therapy evaluation and treatment for low back/R thigh pain. Stiffness of the lumbar spine and hips seems to be the biggest contributor to symptoms as the pain is only experienced during prolonged rest while sleeping. Strength is likely not an issue as patient tested strong and also does not have pain with activity - movement helps him loosen up and is relieving without any increase in pain with extended activity. Physical therapy will focus on improving the articular and soft tissue mobility of the lumbar spine (particularly into flexion) and hips to reduce symptoms and improve ability to sleep. Physical therapy is indicated.  OBJECTIVE IMPAIRMENTS: decreased mobility, decreased ROM, hypomobility, and pain.   ACTIVITY LIMITATIONS: sleeping  REHAB POTENTIAL: Good  CLINICAL DECISION MAKING: Stable/uncomplicated  EVALUATION COMPLEXITY: Low  GOALS: Goals reviewed with patient? yes  SHORT TERM GOALS: Target date: 12/21/2023   Patient will be independent in self management strategies to improve quality of life and functional outcomes. Baseline: New Program Goal status: MET  2.  Patient will report at least 50% improvement in overall symptoms and/or function to demonstrate improved functional mobility Baseline: 0% better; 12/14/23: 50% Goal status: MET  3.  Patient will report at minimum he is able to sleep >4 hours on the Modified Oswestry questionnaire. Baseline: <4 hours; 12/14/23: 6 hours Goal status: MET  LONG TERM GOALS: 01/18/2024   Patient will report at least 75% improvement in overall symptoms and/or function to demonstrate improved functional mobility Baseline: 0% better Goal status: INITIAL  2.  Patient will report he is able to sleep > 6 hours on the  Modified Oswestry questionnaire.  Baseline: <4 hours  Goal status: INITIAL  3.  Patient will demonstrate improve spinal mobility by being able to reach ankles with standing trunk flexion. Baseline: mid shin Goal status: INITIAL  PLAN:  PT FREQUENCY: 2x/week  PT DURATION: 8 weeks  PLANNED INTERVENTIONS: 97110-Therapeutic exercises, 97530- Therapeutic activity, 97112- Neuromuscular re-education, [phone]- Self Care, 47425- Manual therapy, [phone]- Gait training, [phone]- Orthotic Fit/training, [phone]- Canalith repositioning, [id number]- Aquatic Therapy, [id number]- Electrical stimulation (unattended), [phone]- Ionotophoresis 4mg /ml Dexamethasone, Patient/Family education, Balance training, Stair training, Taping, Dry Needling, Joint mobilization, Joint manipulation, Spinal manipulation, Spinal mobilization, Cryotherapy,  and Moist heat   PLAN FOR NEXT SESSION: manual/symptom modification strategies PRN as indicated. Continue with lumbar/hip mobility work as appropriate, pt with tendency to prefer flexion. Continue with core/hip stability work as indicated.   Carlynn Herald, PTA 12/14/2023 10:58 AM

## 2023-12-19 ENCOUNTER — Ambulatory Visit: Payer: Medicare Other | Attending: Anesthesiology

## 2023-12-19 DIAGNOSIS — M5459 Other low back pain: Secondary | ICD-10-CM | POA: Insufficient documentation

## 2023-12-19 DIAGNOSIS — M25652 Stiffness of left hip, not elsewhere classified: Secondary | ICD-10-CM | POA: Insufficient documentation

## 2023-12-19 DIAGNOSIS — M79651 Pain in right thigh: Secondary | ICD-10-CM | POA: Insufficient documentation

## 2023-12-19 DIAGNOSIS — M25651 Stiffness of right hip, not elsewhere classified: Secondary | ICD-10-CM | POA: Insufficient documentation

## 2023-12-19 NOTE — Patient Instructions (Signed)

## 2023-12-19 NOTE — Therapy (Signed)
 OUTPATIENT PHYSICAL THERAPY THORACOLUMBAR TREATMENT   Patient Name: Benjamin Blair MRN: [id number] DOB:1951-07-16, 73 y.o., male Today's Date: 12/19/2023  END OF SESSION:  PT End of Session - 12/19/23 1529     Visit Number 6    Number of Visits 16    Date for PT Re-Evaluation 01/18/24    Authorization Type Medicare & [employer]    Progress Note Due on Visit 10    PT Start Time 1530    PT Stop Time 1608    PT Time Calculation (min) 38 min    Activity Tolerance Patient tolerated treatment well    Behavior During Therapy WFL for tasks assessed/performed             Past Medical History:  Diagnosis Date   Arrhythmia    atrial fibrillation   Chronic renal insufficiency, stage III (moderate) (HCC)    Clotting disorder (HCC)    pulmonary embolism and deep vein thrombosis   Diabetes mellitus without complication (HCC)    Heart murmur    Hypertension    Sleep apnea    wears C-PAP at night   Thoracic aortic dissection [facility])    Past Surgical History:  Procedure Laterality Date   REPAIR OF ACUTE ASCENDING THORACIC AORTIC DISSECTION     Patient Active Problem List   Diagnosis Date Noted   Hammer toes, bilateral 05/13/2020   Hav (hallux abducto valgus), left 02/07/2020   Hav (hallux abducto valgus), right 02/07/2020   Family history of glaucoma 08/08/2019   Diabetes mellitus without complication (HCC) 05/10/2019   Bleeding nose 02/28/2019   Elevated serum immunoglobulin free light chain level 08/21/2018   Hx of dissecting abdominal aortic aneurysm repair 02/03/2018   Morbid obesity (HCC) 02/03/2018   Type 2 diabetes mellitus without retinopathy (HCC) 02/03/2018   Partial small bowel obstruction (HCC) 02/01/2018   Glaucoma suspect of both eyes 07/13/2017   Hyperopia with astigmatism and presbyopia, bilateral 07/13/2017   Long term current use of oral hypoglycemic drug 07/13/2017   Nuclear sclerotic cataract of both eyes 07/13/2017   Vitreous floaters, bilateral 07/13/2017    Stage 3 chronic kidney disease (HCC) 02/10/2016   Proteinuria 02/10/2016   Cholelithiasis with acute on chronic cholecystitis 01/14/2016   Malignant neoplasm of lung (HCC) 12/28/2015   Hematuria 11/09/2015   Hemospermia 11/09/2015   Mixed incontinence 08/17/2015   Benign prostatic hyperplasia with lower urinary tract symptoms 07/20/2015   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   Incontinence 07/20/2015   Obesity (BMI 30.0-34.9) 12/12/2014   Diabetes (HCC) 11/17/2014   Essential hypertension 11/17/2014   Hyperlipidemia 11/17/2014   Acute pulmonary embolism (HCC) 11/17/2014   Atrial fibrillation (HCC) 11/17/2014   Deep vein thrombosis (HCC) 11/17/2014   Obstructive sleep apnea (adult) (pediatric) 11/17/2014   Aortic dissection (HCC) 09/16/2014   Hx of ascending aorta replacement 09/16/2014    PCP: Alain Honey, MD  REFERRING PROVIDER: Ceasar Lund, MD  REFERRING DIAG: lumbar radiculopathy, low back pain, lumbar spondylosis  Rationale for Evaluation and Treatment: Rehabilitation  THERAPY DIAG:  Other low back pain  Pain in right thigh  Stiffness of right hip, not elsewhere classified  Stiffness of left hip, not elsewhere classified  ONSET DATE: 5 months ago (referral date 11/16/2023)  SUBJECTIVE:  SUBJECTIVE STATEMENT: Patient reports he is feeling better. Still has pain first thing in the morning that resolves as the day goes on.   Evaluation 11/23/2023: Patient reported that about 5 months ago he started developing pain along the R hip/thigh region. He did not realize it was coming from his low back but at the visit with the Orthopedist, radiographs were taken and they discovered some stenosis. They trialed an injection and gabapentin, but neither helped with the pain.  They were planning on trying another medication similar to gabapentin but the patient declined due to the potential side effects. The pain is felt the most at night and first thing in the AM. Then, as he moves around and loosens up, the pain gets better. Sleeping on the side seems to be the most comfortable. Lying on the back is not uncomfortable at first, but then will become painful as the night goes on.  PERTINENT HISTORY:  No prior history of pain in this region or other pertinent history per patient report.   PAIN:  Are you having pain? Yes: NPRS scale: none currently; 7.5 at worst/10 Pain location: right anterior thigh Pain description: ache Aggravating factors: first thing in the morning and in the evening Relieving factors: movement    PRECAUTIONS: None  RED FLAGS: None   WEIGHT BEARING RESTRICTIONS: No  FALLS:  Has patient fallen in last 6 months? No  PATIENT GOALS: resolve chief complaint  OBJECTIVE:  Note: Objective measures were completed at Evaluation unless otherwise noted.  DIAGNOSTIC FINDINGS:  Radiographs were taken at an outside facility - patient reported findings of stenosis  PATIENT SURVEYS:  Modified Oswestry: 5/50, 10% disability   COGNITION: Overall cognitive status: Within functional limits for tasks assessed    MUSCLE LENGTH: -Hamstrings: Right 70 deg; Left 70 deg 70 deg at knee 90/90 position -Thomas test: Right lacking 5 deg; Left lacking 5 deg; tested in sidelying with rectus femoris at length  PALPATION: No significant pain with central PA glides of the lumbar spine  LUMBAR ROM:   AROM eval  Flexion Mid shin  Extension WNL  Right lateral flexion WNL  Left lateral flexion WNL  Right rotation   Left rotation    (Blank rows = not tested)  LOWER EXTREMITY ROM:     Passive  Right eval Left eval  Hip flexion 90 90  Hip extension 0 0  Hip abduction    Hip adduction    Hip internal rotation WNL WNL  Hip external rotation 25 25   Knee flexion    Knee extension    Ankle dorsiflexion    Ankle plantarflexion    Ankle inversion    Ankle eversion     (Blank rows = not tested)  LOWER EXTREMITY MMT:    MMT Right eval Left eval  Hip flexion 5 5  Hip extension 5 5  Hip abduction 5 5  Hip adduction 5 5  Hip internal rotation    Hip external rotation    Knee flexion    Knee extension    Ankle dorsiflexion    Ankle plantarflexion    Ankle inversion    Ankle eversion     (Blank rows = not tested)  [facility] Adult PT Treatment:  DATE: 12/19/23 Therapeutic Exercise: SKTC x 10; 5 sec hold  Cat pose x 10   Neuromuscular re-ed: Quadruped leg extension 2 x 10  90/90 toe taps 2 x 10  Figure 4 bridge 2 x 10   Self Care: Sleep positioning recommendations with handout provided   [facility] Adult PT Treatment:                                                DATE: 12/14/2023 Therapeutic Exercise: SKTC  Figure 4 LTR  Thomas stretch w/strap (R) 3x30" Prone: Resisted HS curls: GTB x10, RTB x10 Straight leg hip extension x10 Bent knee hip extension x10 Quad stretch w/strap 3x30" Seated HS stretch Seated hip flexor stretch x30" Dead bug progression: modified --> isometric hold --> full dead bug Standing: Resisted hip flexion + RTB (unilateral march) Resisted hip abd + GTB Resisted 3-way hip + GTB   Neuromuscular re-ed: Hip isometrics/stabilization: Bridge + hip abd iso with black TB 2x10 Bent knee fall out + black TB x15 (B)     [facility] Adult PT Treatment:                                                DATE: 12/11/23 Therapeutic Exercise: SKTC 3x30sec BIL Figure four stretch 3x30sec BIL LTR x8 BIL Standing hip drop + hike off 4 inch step w/ UE support 2x5 BIL cues to reduce compensations at knee Seated swiss ball flexion 2x10 cues for comfortable ROM  Neuromuscular re-ed: Bridge + ball squeeze x8 cues for breath control and core activation Bridge w/o ball x8 Bridge +  red band at knee x8 cues for hip activation Seated hip flexion iso w/ ball 2x10 BIL cues for posture, avoiding compensations at trunk and foot    [facility] Adult PT Treatment:                                                DATE: 12/04/23 Therapeutic Exercise: Modified butterfly stretch x 30 sec Figure 4 stretch x 30 sec each  SKTC x 10  3 way physioball rollout x 2 minutes  Quadruped rocking x 10  DTKC x 10  Updated HEP  Manual Therapy: Rt hip mobilizations inferior, posterior, and lateral grade II-III Rt LAD Passive Rt hip flexion and ER/IR ROM  Passive Rt hamstring stretch  Neuromuscular re-ed: Supine posterior pelvic tilt 2 x 10                                                                                                    PATIENT EDUCATION:  Education details: see treatment; HEP update  Person educated: Patient Education method: Explanation, Demonstration, and Handouts Education comprehension: verbalized understanding  HOME EXERCISE PROGRAM: Access Code: [email or web address] URL: [email or web address] Date: 12/19/2023 Prepared by: Letitia Libra  Exercises - Seated Flexion Stretch  - 1 x daily - 7 x weekly - 3 sets - 10 reps - Standing Quadriceps Stretch  - 1 x daily - 7 x weekly - 3 sets - 30 sec  hold - Supine Lower Trunk Rotation  - 1 x daily - 7 x weekly - 3 sets - 10 reps - Hooklying Single Knee to Chest Stretch with Towel  - 1 x daily - 7 x weekly - 1 sets - 10 reps - 5 sec  hold - Supine Figure 4 Piriformis Stretch  - 1 x daily - 7 x weekly - 3 sets - 30  hold - Quadruped Rocking Backward  - 1 x daily - 7 x weekly - 1 sets - 10 reps - 5 sec  hold - Supine Dead Bug with Leg Extension  - 1 x daily - 7 x weekly - 3 sets - 10 reps - Quadruped Alternating Leg Extensions  - 1 x daily - 7 x weekly - 2 sets - 10 reps  ASSESSMENT:  CLINICAL IMPRESSION: Patient reports pain remains present in Rt anterior thigh when first waking in the morning. We discussed sleep  positioning with using pillow to prop knees to maintain neutral spine in supine and sidelying to determine if this has any effect on his morning pain. Introduced Doctor, hospital with patient demonstrating good trunk stability with quadruped leg extension, but does have difficulty properly performing cat pose as he has tendency to complete quadruped rocking vs. Rounding of the spine.   Evaluation 11/23/2023: Patient is a 73 y.o. who was seen today for physical therapy evaluation and treatment for low back/R thigh pain. Stiffness of the lumbar spine and hips seems to be the biggest contributor to symptoms as the pain is only experienced during prolonged rest while sleeping. Strength is likely not an issue as patient tested strong and also does not have pain with activity - movement helps him loosen up and is relieving without any increase in pain with extended activity. Physical therapy will focus on improving the articular and soft tissue mobility of the lumbar spine (particularly into flexion) and hips to reduce symptoms and improve ability to sleep. Physical therapy is indicated.  OBJECTIVE IMPAIRMENTS: decreased mobility, decreased ROM, hypomobility, and pain.   ACTIVITY LIMITATIONS: sleeping  REHAB POTENTIAL: Good  CLINICAL DECISION MAKING: Stable/uncomplicated  EVALUATION COMPLEXITY: Low  GOALS: Goals reviewed with patient? yes  SHORT TERM GOALS: Target date: 12/21/2023   Patient will be independent in self management strategies to improve quality of life and functional outcomes. Baseline: New Program Goal status: MET  2.  Patient will report at least 50% improvement in overall symptoms and/or function to demonstrate improved functional mobility Baseline: 0% better; 12/14/23: 50% Goal status: MET  3.  Patient will report at minimum he is able to sleep >4 hours on the Modified Oswestry questionnaire. Baseline: <4 hours; 12/14/23: 6 hours Goal status: MET  LONG TERM GOALS:  01/18/2024   Patient will report at least 75% improvement in overall symptoms and/or function to demonstrate improved functional mobility Baseline: 0% better Goal status: INITIAL  2.  Patient will report he is able to sleep > 6 hours on the Modified Oswestry questionnaire.  Baseline: <4 hours  Goal status: INITIAL  3.  Patient will demonstrate improve spinal mobility by being able to reach ankles with standing trunk flexion. Baseline: mid shin Goal status:  INITIAL  PLAN:  PT FREQUENCY: 2x/week  PT DURATION: 8 weeks  PLANNED INTERVENTIONS: 97110-Therapeutic exercises, 97530- Therapeutic activity, [id number]- Neuromuscular re-education, 97535- Self Care, [id number]- Manual therapy, [phone]- Gait training, [phone]- Orthotic Fit/training, [phone]- Canalith repositioning, [id number]- Aquatic Therapy, [id number]- Electrical stimulation (unattended), [phone]- Ionotophoresis 4mg /ml Dexamethasone, Patient/Family education, Balance training, Stair training, Taping, Dry Needling, Joint mobilization, Joint manipulation, Spinal manipulation, Spinal mobilization, Cryotherapy, and Moist heat   PLAN FOR NEXT SESSION: manual/symptom modification strategies PRN as indicated. Continue with lumbar/hip mobility work as appropriate, pt with tendency to prefer flexion. Continue with core/hip stability work as indicated.   Letitia Libra, PT, DPT, ATC 12/19/23 4:09 PM

## 2023-12-21 ENCOUNTER — Ambulatory Visit: Payer: Medicare Other

## 2023-12-21 DIAGNOSIS — M5459 Other low back pain: Secondary | ICD-10-CM | POA: Diagnosis not present

## 2023-12-21 DIAGNOSIS — M25652 Stiffness of left hip, not elsewhere classified: Secondary | ICD-10-CM

## 2023-12-21 DIAGNOSIS — M79651 Pain in right thigh: Secondary | ICD-10-CM

## 2023-12-21 DIAGNOSIS — M25651 Stiffness of right hip, not elsewhere classified: Secondary | ICD-10-CM

## 2023-12-21 NOTE — Therapy (Signed)
 OUTPATIENT PHYSICAL THERAPY THORACOLUMBAR TREATMENT   Patient Name: Benjamin Blair MRN: [id number] DOB:1951-01-26, 73 y.o., male Today's Date: 12/21/2023  END OF SESSION:  PT End of Session - 12/21/23 1101     Visit Number 7    Number of Visits 16    Date for PT Re-Evaluation 01/18/24    Authorization Type Medicare & AARP    Progress Note Due on Visit 10    PT Start Time 1101    PT Stop Time 1140    PT Time Calculation (min) 39 min    Activity Tolerance Patient tolerated treatment well    Behavior During Therapy WFL for tasks assessed/performed              Past Medical History:  Diagnosis Date   Arrhythmia    atrial fibrillation   Chronic renal insufficiency, stage III (moderate) (HCC)    Clotting disorder (HCC)    pulmonary embolism and deep vein thrombosis   Diabetes mellitus without complication (HCC)    Heart murmur    Hypertension    Sleep apnea    wears C-PAP at night   Thoracic aortic dissection [facility])    Past Surgical History:  Procedure Laterality Date   REPAIR OF ACUTE ASCENDING THORACIC AORTIC DISSECTION     Patient Active Problem List   Diagnosis Date Noted   Hammer toes, bilateral 05/13/2020   Hav (hallux abducto valgus), left 02/07/2020   Hav (hallux abducto valgus), right 02/07/2020   Family history of glaucoma 08/08/2019   Diabetes mellitus without complication (HCC) 05/10/2019   Bleeding nose 02/28/2019   Elevated serum immunoglobulin free light chain level 08/21/2018   Hx of dissecting abdominal aortic aneurysm repair 02/03/2018   Morbid obesity (HCC) 02/03/2018   Type 2 diabetes mellitus without retinopathy (HCC) 02/03/2018   Partial small bowel obstruction (HCC) 02/01/2018   Glaucoma suspect of both eyes 07/13/2017   Hyperopia with astigmatism and presbyopia, bilateral 07/13/2017   Long term current use of oral hypoglycemic drug 07/13/2017   Nuclear sclerotic cataract of both eyes 07/13/2017   Vitreous floaters, bilateral 07/13/2017    Stage 3 chronic kidney disease (HCC) 02/10/2016   Proteinuria 02/10/2016   Cholelithiasis with acute on chronic cholecystitis 01/14/2016   Malignant neoplasm of lung (HCC) 12/28/2015   Hematuria 11/09/2015   Hemospermia 11/09/2015   Mixed incontinence 08/17/2015   Benign prostatic hyperplasia with lower urinary tract symptoms 07/20/2015   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   Incontinence 07/20/2015   Obesity (BMI 30.0-34.9) 12/12/2014   Diabetes (HCC) 11/17/2014   Essential hypertension 11/17/2014   Hyperlipidemia 11/17/2014   Acute pulmonary embolism (HCC) 11/17/2014   Atrial fibrillation (HCC) 11/17/2014   Deep vein thrombosis (HCC) 11/17/2014   Obstructive sleep apnea (adult) (pediatric) 11/17/2014   Aortic dissection (HCC) 09/16/2014   Hx of ascending aorta replacement 09/16/2014    PCP: Alain Honey, MD  REFERRING PROVIDER: Ceasar Lund, MD  REFERRING DIAG: lumbar radiculopathy, low back pain, lumbar spondylosis  Rationale for Evaluation and Treatment: Rehabilitation  THERAPY DIAG:  Other low back pain  Pain in right thigh  Stiffness of right hip, not elsewhere classified  Stiffness of left hip, not elsewhere classified  ONSET DATE: 5 months ago (referral date 11/16/2023)  SUBJECTIVE:  SUBJECTIVE STATEMENT: Patient reports the sleep positioning with the pillow has been extremely helpful. Has not been waking up with pain, but will still get pain in the thigh towards the end of the day.  Evaluation 11/23/2023: Patient reported that about 5 months ago he started developing pain along the R hip/thigh region. He did not realize it was coming from his low back but at the visit with the Orthopedist, radiographs were taken and they discovered some stenosis. They  trialed an injection and gabapentin, but neither helped with the pain. They were planning on trying another medication similar to gabapentin but the patient declined due to the potential side effects. The pain is felt the most at night and first thing in the AM. Then, as he moves around and loosens up, the pain gets better. Sleeping on the side seems to be the most comfortable. Lying on the back is not uncomfortable at first, but then will become painful as the night goes on.  PERTINENT HISTORY:  No prior history of pain in this region or other pertinent history per patient report.   PAIN:  Are you having pain? Yes: NPRS scale: none currently; 6 at worst/10 Pain location: right anterior thigh Pain description: ache Aggravating factors: towards end of day Relieving factors: movement    PRECAUTIONS: None  RED FLAGS: None   WEIGHT BEARING RESTRICTIONS: No  FALLS:  Has patient fallen in last 6 months? No  PATIENT GOALS: resolve chief complaint  OBJECTIVE:  Note: Objective measures were completed at Evaluation unless otherwise noted.  DIAGNOSTIC FINDINGS:  Radiographs were taken at an outside facility - patient reported findings of stenosis  PATIENT SURVEYS:  Modified Oswestry: 5/50, 10% disability   COGNITION: Overall cognitive status: Within functional limits for tasks assessed    MUSCLE LENGTH: -Hamstrings: Right 70 deg; Left 70 deg 70 deg at knee 90/90 position -Thomas test: Right lacking 5 deg; Left lacking 5 deg; tested in sidelying with rectus femoris at length  PALPATION: No significant pain with central PA glides of the lumbar spine  LUMBAR ROM:   AROM eval  Flexion Mid shin  Extension WNL  Right lateral flexion WNL  Left lateral flexion WNL  Right rotation   Left rotation    (Blank rows = not tested)  LOWER EXTREMITY ROM:     Passive  Right eval Left eval  Hip flexion 90 90  Hip extension 0 0  Hip abduction    Hip adduction    Hip internal rotation  WNL WNL  Hip external rotation 25 25  Knee flexion    Knee extension    Ankle dorsiflexion    Ankle plantarflexion    Ankle inversion    Ankle eversion     (Blank rows = not tested)  LOWER EXTREMITY MMT:    MMT Right eval Left eval  Hip flexion 5 5  Hip extension 5 5  Hip abduction 5 5  Hip adduction 5 5  Hip internal rotation    Hip external rotation    Knee flexion    Knee extension    Ankle dorsiflexion    Ankle plantarflexion    Ankle inversion    Ankle eversion     (Blank rows = not tested) [facility] Adult PT Treatment:  DATE: 12/21/23 Therapeutic Exercise: Seated trunk flexion x 10  Reviewed and updated HEP discussing frequency/implementation of flexion biased movement  Physioball rollout x 1 minute   Neuromuscular re-ed: Supine posterior pelvic tilts 2 x 10  Supine TA march 2 x 10  Supine bent knee fallout 2  x10  Pallof press green band 2 x 10   Self Care: Discussed lumbar anatomy as it relates to current condition   [facility] Adult PT Treatment:                                                DATE: 12/19/23 Therapeutic Exercise: SKTC x 10; 5 sec hold  Cat pose x 10   Neuromuscular re-ed: Quadruped leg extension 2 x 10  90/90 toe taps 2 x 10  Figure 4 bridge 2 x 10   Self Care: Sleep positioning recommendations with handout provided   [facility] Adult PT Treatment:                                                DATE: 12/14/2023 Therapeutic Exercise: SKTC  Figure 4 LTR  Thomas stretch w/strap (R) 3x30" Prone: Resisted HS curls: GTB x10, RTB x10 Straight leg hip extension x10 Bent knee hip extension x10 Quad stretch w/strap 3x30" Seated HS stretch Seated hip flexor stretch x30" Dead bug progression: modified --> isometric hold --> full dead bug Standing: Resisted hip flexion + RTB (unilateral march) Resisted hip abd + GTB Resisted 3-way hip + GTB   Neuromuscular re-ed: Hip isometrics/stabilization: Bridge + hip  abd iso with black TB 2x10 Bent knee fall out + black TB x15 (B)                                                                                                     PATIENT EDUCATION:  Education details: see treatment; HEP update  Person educated: Patient Education method: Explanation, Demonstration, and Handouts Education comprehension: verbalized understanding   HOME EXERCISE PROGRAM: Access Code: [email or web address] URL: [email or web address] Date: 12/21/2023 Prepared by: Letitia Libra  Exercises - Seated Flexion Stretch  - 3 x daily - 7 x weekly - 1 sets - 10 reps - Standing Quadriceps Stretch  - 1 x daily - 7 x weekly - 3 sets - 30 sec  hold - Supine Lower Trunk Rotation  - 1 x daily - 7 x weekly - 3 sets - 10 reps - Hooklying Single Knee to Chest Stretch with Towel  - 1 x daily - 7 x weekly - 1 sets - 10 reps - 5 sec  hold - Supine Figure 4 Piriformis Stretch  - 1 x daily - 7 x weekly - 3 sets - 30  hold - Quadruped Rocking Backward  - 1 x daily - 7 x weekly - 1 sets - 10 reps - 5  sec  hold - Supine Dead Bug with Leg Extension  - 1 x daily - 7 x weekly - 3 sets - 10 reps - Quadruped Alternating Leg Extensions  - 1 x daily - 7 x weekly - 2 sets - 10 reps  ASSESSMENT:  CLINICAL IMPRESSION: Patient reports significant improvement in morning pain with sleep modifications that were addressed at last session. Still gets pain in the thigh towards the end of the day, so discussed implementing flexion biased movement throughout the day/as needed for pain control with patient verbalizing understanding. Continued with core stabilization with patient requiring initial cues to achieve core engagement/posterior pelvic tilt with hooklying progression. No reports of pain throughout session.   Evaluation 11/23/2023: Patient is a 73 y.o. who was seen today for physical therapy evaluation and treatment for low back/R thigh pain. Stiffness of the lumbar spine and hips seems to be the biggest  contributor to symptoms as the pain is only experienced during prolonged rest while sleeping. Strength is likely not an issue as patient tested strong and also does not have pain with activity - movement helps him loosen up and is relieving without any increase in pain with extended activity. Physical therapy will focus on improving the articular and soft tissue mobility of the lumbar spine (particularly into flexion) and hips to reduce symptoms and improve ability to sleep. Physical therapy is indicated.  OBJECTIVE IMPAIRMENTS: decreased mobility, decreased ROM, hypomobility, and pain.   ACTIVITY LIMITATIONS: sleeping  REHAB POTENTIAL: Good  CLINICAL DECISION MAKING: Stable/uncomplicated  EVALUATION COMPLEXITY: Low  GOALS: Goals reviewed with patient? yes  SHORT TERM GOALS: Target date: 12/21/2023   Patient will be independent in self management strategies to improve quality of life and functional outcomes. Baseline: New Program Goal status: MET  2.  Patient will report at least 50% improvement in overall symptoms and/or function to demonstrate improved functional mobility Baseline: 0% better; 12/14/23: 50% Goal status: MET  3.  Patient will report at minimum he is able to sleep >4 hours on the Modified Oswestry questionnaire. Baseline: <4 hours; 12/14/23: 6 hours Goal status: MET  LONG TERM GOALS: 01/18/2024   Patient will report at least 75% improvement in overall symptoms and/or function to demonstrate improved functional mobility Baseline: 0% better Goal status: INITIAL  2.  Patient will report he is able to sleep > 6 hours on the Modified Oswestry questionnaire.  Baseline: <4 hours  Goal status: INITIAL  3.  Patient will demonstrate improve spinal mobility by being able to reach ankles with standing trunk flexion. Baseline: mid shin Goal status: INITIAL  PLAN:  PT FREQUENCY: 2x/week  PT DURATION: 8 weeks  PLANNED INTERVENTIONS: 97110-Therapeutic exercises, 97530-  Therapeutic activity, [id number]- Neuromuscular re-education, 97535- Self Care, 82956- Manual therapy, [phone]- Gait training, [phone]- Orthotic Fit/training, [phone]- Canalith repositioning, [id number]- Aquatic Therapy, 97014- Electrical stimulation (unattended), [phone]- Ionotophoresis 4mg /ml Dexamethasone, Patient/Family education, Balance training, Stair training, Taping, Dry Needling, Joint mobilization, Joint manipulation, Spinal manipulation, Spinal mobilization, Cryotherapy, and Moist heat   PLAN FOR NEXT SESSION: manual/symptom modification strategies PRN as indicated. Continue with lumbar/hip mobility work as appropriate, pt with tendency to prefer flexion. Continue with core/hip stability work as indicated.   Letitia Libra, PT, DPT, ATC 12/21/23 11:41 AM

## 2023-12-26 ENCOUNTER — Ambulatory Visit: Payer: Medicare Other

## 2023-12-26 DIAGNOSIS — M5459 Other low back pain: Secondary | ICD-10-CM | POA: Diagnosis not present

## 2023-12-26 DIAGNOSIS — M25652 Stiffness of left hip, not elsewhere classified: Secondary | ICD-10-CM

## 2023-12-26 DIAGNOSIS — M79651 Pain in right thigh: Secondary | ICD-10-CM

## 2023-12-26 DIAGNOSIS — M25651 Stiffness of right hip, not elsewhere classified: Secondary | ICD-10-CM

## 2023-12-26 NOTE — Therapy (Signed)
 OUTPATIENT PHYSICAL THERAPY THORACOLUMBAR TREATMENT   Patient Name: Ladarrell Cornwall MRN: [id number] DOB:May 23, 1951, 73 y.o., male Today's Date: 12/26/2023  END OF SESSION:  PT End of Session - 12/26/23 1100     Visit Number 8    Number of Visits 16    Date for PT Re-Evaluation 01/18/24    Authorization Type Medicare & AARP    Progress Note Due on Visit 10    PT Start Time 1100    PT Stop Time 1130   ended early per patient request   PT Time Calculation (min) 30 min    Activity Tolerance Patient tolerated treatment well    Behavior During Therapy WFL for tasks assessed/performed            Past Medical History:  Diagnosis Date   Arrhythmia    atrial fibrillation   Chronic renal insufficiency, stage III (moderate) (HCC)    Clotting disorder (HCC)    pulmonary embolism and deep vein thrombosis   Diabetes mellitus without complication (HCC)    Heart murmur    Hypertension    Sleep apnea    wears C-PAP at night   Thoracic aortic dissection [facility])    Past Surgical History:  Procedure Laterality Date   REPAIR OF ACUTE ASCENDING THORACIC AORTIC DISSECTION     Patient Active Problem List   Diagnosis Date Noted   Hammer toes, bilateral 05/13/2020   Hav (hallux abducto valgus), left 02/07/2020   Hav (hallux abducto valgus), right 02/07/2020   Family history of glaucoma 08/08/2019   Diabetes mellitus without complication (HCC) 05/10/2019   Bleeding nose 02/28/2019   Elevated serum immunoglobulin free light chain level 08/21/2018   Hx of dissecting abdominal aortic aneurysm repair 02/03/2018   Morbid obesity (HCC) 02/03/2018   Type 2 diabetes mellitus without retinopathy (HCC) 02/03/2018   Partial small bowel obstruction (HCC) 02/01/2018   Glaucoma suspect of both eyes 07/13/2017   Hyperopia with astigmatism and presbyopia, bilateral 07/13/2017   Long term current use of oral hypoglycemic drug 07/13/2017   Nuclear sclerotic cataract of both eyes 07/13/2017   Vitreous  floaters, bilateral 07/13/2017   Stage 3 chronic kidney disease (HCC) 02/10/2016   Proteinuria 02/10/2016   Cholelithiasis with acute on chronic cholecystitis 01/14/2016   Malignant neoplasm of lung (HCC) 12/28/2015   Hematuria 11/09/2015   Hemospermia 11/09/2015   Mixed incontinence 08/17/2015   Benign prostatic hyperplasia with lower urinary tract symptoms 07/20/2015   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   Incontinence 07/20/2015   Obesity (BMI 30.0-34.9) 12/12/2014   Diabetes (HCC) 11/17/2014   Essential hypertension 11/17/2014   Hyperlipidemia 11/17/2014   Acute pulmonary embolism (HCC) 11/17/2014   Atrial fibrillation (HCC) 11/17/2014   Deep vein thrombosis (HCC) 11/17/2014   Obstructive sleep apnea (adult) (pediatric) 11/17/2014   Aortic dissection (HCC) 09/16/2014   Hx of ascending aorta replacement 09/16/2014    PCP: Alain Honey, MD  REFERRING PROVIDER: Ceasar Lund, MD  REFERRING DIAG: lumbar radiculopathy, low back pain, lumbar spondylosis  Rationale for Evaluation and Treatment: Rehabilitation  THERAPY DIAG:  Other low back pain  Pain in right thigh  Stiffness of right hip, not elsewhere classified  Stiffness of left hip, not elsewhere classified  ONSET DATE: 5 months ago (referral date 11/16/2023)  SUBJECTIVE:  SUBJECTIVE STATEMENT: Patient reports he has been stretching at night before bed and first thing in the morning and has been helping a lot with the pain. Patient states he continues to have pain in R thigh at end of the day but not as bad. Patient requests ending session early due for another appointment.  Evaluation 11/23/2023: Patient reported that about 5 months ago he started developing pain along the R hip/thigh region. He did not realize it  was coming from his low back but at the visit with the Orthopedist, radiographs were taken and they discovered some stenosis. They trialed an injection and gabapentin, but neither helped with the pain. They were planning on trying another medication similar to gabapentin but the patient declined due to the potential side effects. The pain is felt the most at night and first thing in the AM. Then, as he moves around and loosens up, the pain gets better. Sleeping on the side seems to be the most comfortable. Lying on the back is not uncomfortable at first, but then will become painful as the night goes on.  PERTINENT HISTORY:  No prior history of pain in this region or other pertinent history per patient report.   PAIN:  Are you having pain? Yes: NPRS scale: none currently; 6 at worst/10 Pain location: right anterior thigh Pain description: ache Aggravating factors: towards end of day Relieving factors: movement    PRECAUTIONS: None  RED FLAGS: None   WEIGHT BEARING RESTRICTIONS: No  FALLS:  Has patient fallen in last 6 months? No  PATIENT GOALS: resolve chief complaint  OBJECTIVE:  Note: Objective measures were completed at Evaluation unless otherwise noted.  DIAGNOSTIC FINDINGS:  Radiographs were taken at an outside facility - patient reported findings of stenosis  PATIENT SURVEYS:  Modified Oswestry: 5/50, 10% disability   COGNITION: Overall cognitive status: Within functional limits for tasks assessed    MUSCLE LENGTH: -Hamstrings: Right 70 deg; Left 70 deg 70 deg at knee 90/90 position -Thomas test: Right lacking 5 deg; Left lacking 5 deg; tested in sidelying with rectus femoris at length  PALPATION: No significant pain with central PA glides of the lumbar spine  LUMBAR ROM:   AROM eval  Flexion Mid shin  Extension WNL  Right lateral flexion WNL  Left lateral flexion WNL  Right rotation   Left rotation    (Blank rows = not tested)  LOWER EXTREMITY ROM:      Passive  Right eval Left eval  Hip flexion 90 90  Hip extension 0 0  Hip abduction    Hip adduction    Hip internal rotation WNL WNL  Hip external rotation 25 25  Knee flexion    Knee extension    Ankle dorsiflexion    Ankle plantarflexion    Ankle inversion    Ankle eversion     (Blank rows = not tested)  LOWER EXTREMITY MMT:    MMT Right eval Left eval  Hip flexion 5 5  Hip extension 5 5  Hip abduction 5 5  Hip adduction 5 5  Hip internal rotation    Hip external rotation    Knee flexion    Knee extension    Ankle dorsiflexion    Ankle plantarflexion    Ankle inversion    Ankle eversion     (Blank rows = not tested)  [facility] Adult PT Treatment:  DATE: 12/26/2023 Therapeutic Exercise: Seated red PB roll out (front) Seated forward trunk flexion stretch  Dead bug isometric hold Dead bug with arm/leg extensions 2x10 Standing quad stretch w/strap Neuromuscular re-ed: Supine: (proprioceptive awareness) Posterior pelvic tilt x15 Core marching in PPT x10 Bridges with PPT + ball b/w knees for hup add iso activation x10 Bent knee fall out + blue TB (unilateral pelvic stabilization) x15 (B) Seated hip hinge  Standing squat with focus on hip hinge mechanics  [facility] Adult PT Treatment:                                                DATE: 12/21/23 Therapeutic Exercise: Seated trunk flexion x 10  Reviewed and updated HEP discussing frequency/implementation of flexion biased movement  Physioball rollout x 1 minute   Neuromuscular re-ed: Supine posterior pelvic tilts 2 x 10  Supine TA march 2 x 10  Supine bent knee fallout 2  x10  Pallof press green band 2 x 10   Self Care: Discussed lumbar anatomy as it relates to current condition   [facility] Adult PT Treatment:                                                DATE: 12/19/23 Therapeutic Exercise: SKTC x 10; 5 sec hold  Cat pose x 10   Neuromuscular re-ed: Quadruped leg  extension 2 x 10  90/90 toe taps 2 x 10  Figure 4 bridge 2 x 10   Self Care: Sleep positioning recommendations with handout provided         PATIENT EDUCATION:  Education details: see treatment; HEP update  Person educated: Patient Education method: Explanation, Demonstration, and Handouts Education comprehension: verbalized understanding   HOME EXERCISE PROGRAM: Access Code: [email or web address] URL: [email or web address] Date: 12/21/2023 Prepared by: Letitia Libra  Exercises - Seated Flexion Stretch  - 3 x daily - 7 x weekly - 1 sets - 10 reps - Standing Quadriceps Stretch  - 1 x daily - 7 x weekly - 3 sets - 30 sec  hold - Supine Lower Trunk Rotation  - 1 x daily - 7 x weekly - 3 sets - 10 reps - Hooklying Single Knee to Chest Stretch with Towel  - 1 x daily - 7 x weekly - 1 sets - 10 reps - 5 sec  hold - Supine Figure 4 Piriformis Stretch  - 1 x daily - 7 x weekly - 3 sets - 30  hold - Quadruped Rocking Backward  - 1 x daily - 7 x weekly - 1 sets - 10 reps - 5 sec  hold - Supine Dead Bug with Leg Extension  - 1 x daily - 7 x weekly - 3 sets - 10 reps - Quadruped Alternating Leg Extensions  - 1 x daily - 7 x weekly - 2 sets - 10 reps  ASSESSMENT:  CLINICAL IMPRESSION: Hip hinge mechanics progressed from sitting to standing; improved core stabilization and postural alignment demonstrated with mild lumbar fatigue. Cueing improved posterior pelvic tilt mechanics and decrease lumbar/glute tension. Patient will continue to benefit from core strengthening and postural awareness activities.  Evaluation 11/23/2023: Patient is a 73 y.o. who was seen today for physical therapy evaluation and treatment for  low back/R thigh pain. Stiffness of the lumbar spine and hips seems to be the biggest contributor to symptoms as the pain is only experienced during prolonged rest while sleeping. Strength is likely not an issue as patient tested strong and also does not have pain with activity -  movement helps him loosen up and is relieving without any increase in pain with extended activity. Physical therapy will focus on improving the articular and soft tissue mobility of the lumbar spine (particularly into flexion) and hips to reduce symptoms and improve ability to sleep. Physical therapy is indicated.  OBJECTIVE IMPAIRMENTS: decreased mobility, decreased ROM, hypomobility, and pain.   ACTIVITY LIMITATIONS: sleeping  REHAB POTENTIAL: Good  CLINICAL DECISION MAKING: Stable/uncomplicated  EVALUATION COMPLEXITY: Low  GOALS: Goals reviewed with patient? yes  SHORT TERM GOALS: Target date: 12/21/2023   Patient will be independent in self management strategies to improve quality of life and functional outcomes. Baseline: New Program Goal status: MET  2.  Patient will report at least 50% improvement in overall symptoms and/or function to demonstrate improved functional mobility Baseline: 0% better; 12/14/23: 50% Goal status: MET  3.  Patient will report at minimum he is able to sleep >4 hours on the Modified Oswestry questionnaire. Baseline: <4 hours; 12/14/23: 6 hours Goal status: MET  LONG TERM GOALS: 01/18/2024   Patient will report at least 75% improvement in overall symptoms and/or function to demonstrate improved functional mobility Baseline: 0% better Goal status: INITIAL  2.  Patient will report he is able to sleep > 6 hours on the Modified Oswestry questionnaire.  Baseline: <4 hours  Goal status: INITIAL  3.  Patient will demonstrate improve spinal mobility by being able to reach ankles with standing trunk flexion. Baseline: mid shin Goal status: INITIAL  PLAN:  PT FREQUENCY: 2x/week  PT DURATION: 8 weeks  PLANNED INTERVENTIONS: 97110-Therapeutic exercises, 97530- Therapeutic activity, [id number]- Neuromuscular re-education, 97535- Self Care, 19147- Manual therapy, [phone]- Gait training, [phone]- Orthotic Fit/training, [phone]- Canalith repositioning, [id number]- Aquatic  Therapy, 97014- Electrical stimulation (unattended), [phone]- Ionotophoresis 4mg /ml Dexamethasone, Patient/Family education, Balance training, Stair training, Taping, Dry Needling, Joint mobilization, Joint manipulation, Spinal manipulation, Spinal mobilization, Cryotherapy, and Moist heat   PLAN FOR NEXT SESSION: manual/symptom modification strategies PRN as indicated. Continue with lumbar/hip mobility work as appropriate, pt with tendency to prefer flexion. Continue with core/hip stability work as indicated.   Carlynn Herald, PTA 12/26/23 11:37 AM

## 2023-12-28 ENCOUNTER — Ambulatory Visit: Payer: Medicare Other

## 2023-12-28 DIAGNOSIS — M5459 Other low back pain: Secondary | ICD-10-CM

## 2023-12-28 DIAGNOSIS — M25652 Stiffness of left hip, not elsewhere classified: Secondary | ICD-10-CM

## 2023-12-28 DIAGNOSIS — M25651 Stiffness of right hip, not elsewhere classified: Secondary | ICD-10-CM

## 2023-12-28 DIAGNOSIS — M79651 Pain in right thigh: Secondary | ICD-10-CM

## 2023-12-28 NOTE — Therapy (Signed)
 OUTPATIENT PHYSICAL THERAPY THORACOLUMBAR PROGRESS NOTE   Patient Name: Masaji Billups MRN: [id number] DOB:[birth year], 73 y.o., male Today's Date: 12/28/2023  END OF SESSION:  PT End of Session - 12/28/23 1240     Visit Number 9    Number of Visits 16    Date for PT Re-Evaluation 01/18/24    Authorization Type Medicare & [employer]    Progress Note Due on Visit 19    PT Start Time 1100    PT Stop Time 1140    PT Time Calculation (min) 40 min    Activity Tolerance Patient tolerated treatment well    Behavior During Therapy WFL for tasks assessed/performed             Past Medical History:  Diagnosis Date   Arrhythmia    atrial fibrillation   Chronic renal insufficiency, stage III (moderate) (HCC)    Clotting disorder (HCC)    pulmonary embolism and deep vein thrombosis   Diabetes mellitus without complication (HCC)    Heart murmur    Hypertension    Sleep apnea    wears C-PAP at night   Thoracic aortic dissection [facility])    Past Surgical History:  Procedure Laterality Date   REPAIR OF ACUTE ASCENDING THORACIC AORTIC DISSECTION     Patient Active Problem List   Diagnosis Date Noted   Hammer toes, bilateral 05/13/2020   Hav (hallux abducto valgus), left 02/07/2020   Hav (hallux abducto valgus), right 02/07/2020   Family history of glaucoma 08/08/2019   Diabetes mellitus without complication (HCC) 05/10/2019   Bleeding nose 02/28/2019   Elevated serum immunoglobulin free light chain level 08/21/2018   Hx of dissecting abdominal aortic aneurysm repair 02/03/2018   Morbid obesity (HCC) 02/03/2018   Type 2 diabetes mellitus without retinopathy (HCC) 02/03/2018   Partial small bowel obstruction (HCC) 02/01/2018   Glaucoma suspect of both eyes 07/13/2017   Hyperopia with astigmatism and presbyopia, bilateral 07/13/2017   Long term current use of oral hypoglycemic drug 07/13/2017   Nuclear sclerotic cataract of both eyes 07/13/2017   Vitreous floaters, bilateral 07/13/2017    Stage 3 chronic kidney disease (HCC) 02/10/2016   Proteinuria 02/10/2016   Cholelithiasis with acute on chronic cholecystitis 01/14/2016   Malignant neoplasm of lung (HCC) 12/28/2015   Hematuria 11/09/2015   Hemospermia 11/09/2015   Mixed incontinence 08/17/2015   Benign prostatic hyperplasia with lower urinary tract symptoms 07/20/2015   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   Incontinence 07/20/2015   Obesity (BMI 30.0-34.9) 12/12/2014   Diabetes (HCC) 11/17/2014   Essential hypertension 11/17/2014   Hyperlipidemia 11/17/2014   Acute pulmonary embolism (HCC) 11/17/2014   Atrial fibrillation (HCC) 11/17/2014   Deep vein thrombosis (HCC) 11/17/2014   Obstructive sleep apnea (adult) (pediatric) 11/17/2014   Aortic dissection (HCC) 09/16/2014   Hx of ascending aorta replacement 09/16/2014    PCP: Alain Honey, MD  REFERRING PROVIDER: Ceasar Lund, MD  REFERRING DIAG: lumbar radiculopathy, low back pain, lumbar spondylosis  Rationale for Evaluation and Treatment: Rehabilitation  THERAPY DIAG:  Other low back pain  Pain in right thigh  Stiffness of right hip, not elsewhere classified  Stiffness of left hip, not elsewhere classified  ONSET DATE: 5 months ago (referral date 11/16/2023)  SUBJECTIVE:  SUBJECTIVE STATEMENT: 12/28/2023: The patient returned to the clinic stating "physical therapy is working". He has found a lot of benefit from the stretching exercises and uses spinal flexion stretching regularly to help with his symptoms. Placing a pillow between his knees also is having a large impact. He thinks continuing with physical therapy will help him improve even further - his pain is mostly just in the morning now but does improve with his stretches.  Evaluation  11/23/2023: Patient reported that about 5 months ago he started developing pain along the R hip/thigh region. He did not realize it was coming from his low back but at the visit with the Orthopedist, radiographs were taken and they discovered some stenosis. They trialed an injection and gabapentin, but neither helped with the pain. They were planning on trying another medication similar to gabapentin but the patient declined due to the potential side effects. The pain is felt the most at night and first thing in the AM. Then, as he moves around and loosens up, the pain gets better. Sleeping on the side seems to be the most comfortable. Lying on the back is not uncomfortable at first, but then will become painful as the night goes on.  PERTINENT HISTORY:  No prior history of pain in this region or other pertinent history per patient report.   PAIN:  Are you having pain? Yes: NPRS scale: none currently; 6 at worst/10 Pain location: right anterior thigh Pain description: ache Aggravating factors: towards end of day Relieving factors: movement    PRECAUTIONS: None  RED FLAGS: None   WEIGHT BEARING RESTRICTIONS: No  FALLS:  Has patient fallen in last 6 months? No  PATIENT GOALS: resolve chief complaint  OBJECTIVE:  Note: Objective measures were completed at Evaluation unless otherwise noted.  DIAGNOSTIC FINDINGS:  Radiographs were taken at an outside facility - patient reported findings of stenosis  PATIENT SURVEYS:  Modified Oswestry: 5/50, 10% disability   COGNITION: Overall cognitive status: Within functional limits for tasks assessed    MUSCLE LENGTH: -Hamstrings: Right 70 deg; Left 70 deg 70 deg at knee 90/90 position -Thomas test: Right lacking 5 deg; Left lacking 5 deg; tested in sidelying with rectus femoris at length  PALPATION: No significant pain with central PA glides of the lumbar spine  LUMBAR ROM:   AROM eval 03/13  Flexion Mid shin Distal 1/3 of shin   Extension WNL   Right lateral flexion WNL   Left lateral flexion WNL   Right rotation    Left rotation     (Blank rows = not tested)  LOWER EXTREMITY ROM:     Passive  Right eval Left eval Right 3/13 Left 3/13  Hip flexion 90 90 100 100  Hip extension 0 0 0 0  Hip abduction      Hip adduction      Hip internal rotation WNL WNL WNL WNL  Hip external rotation 25 25 30 30   Knee flexion      Knee extension      Ankle dorsiflexion      Ankle plantarflexion      Ankle inversion      Ankle eversion       (Blank rows = not tested)  LOWER EXTREMITY MMT:    MMT Right eval Left eval  Hip flexion 5 5  Hip extension 5 5  Hip abduction 5 5  Hip adduction 5 5  Hip internal rotation    Hip external rotation    Knee  flexion    Knee extension    Ankle dorsiflexion    Ankle plantarflexion    Ankle inversion    Ankle eversion     (Blank rows = not tested)  [facility] Adult PT Treatment:                                                DATE: 12/28/2023 Therapeutic Exercise: Supine: R and L hip: manual stretching into flexion, flexion + abduction, flexion + adduction Manual stretching into double knees to chest, pressing at knees and pulling with strap behind knees Quadruped: Spinal flexion over green physioball, 3 x 30 sec holds Child's pose stretch, 3 x 30 sec holds Seated: Spinal flexion hugging orange physioball, x 10 Standing: Spinal flexion over strap at hips shut into top of door, x 10 Manual Therapy: Hooklying: R and L hip: long axis posterolateral glides through femur Seated: Lumbar: mobilization with movement into flexion gliding at spinous processes Lumbar: mobilization with movement into flexion SNAG using mobilization belt  [facility] Adult PT Treatment:                                                DATE: 12/26/2023 Therapeutic Exercise: Seated red PB roll out (front) Seated forward trunk flexion stretch  Dead bug isometric hold Dead bug with arm/leg extensions  2x10 Standing quad stretch w/strap Neuromuscular re-ed: Supine: (proprioceptive awareness) Posterior pelvic tilt x15 Core marching in PPT x10 Bridges with PPT + ball b/w knees for hup add iso activation x10 Bent knee fall out + blue TB (unilateral pelvic stabilization) x15 (B) Seated hip hinge  Standing squat with focus on hip hinge mechanics  [facility] Adult PT Treatment:                                                DATE: 12/21/23 Therapeutic Exercise: Seated trunk flexion x 10  Reviewed and updated HEP discussing frequency/implementation of flexion biased movement  Physioball rollout x 1 minute   Neuromuscular re-ed: Supine posterior pelvic tilts 2 x 10  Supine TA march 2 x 10  Supine bent knee fallout 2  x10  Pallof press green band 2 x 10   Self Care: Discussed lumbar anatomy as it relates to current condition     PATIENT EDUCATION:  Education details: see treatment; HEP update  Person educated: Patient Education method: Explanation, Demonstration, and Handouts Education comprehension: verbalized understanding   HOME EXERCISE PROGRAM: Access Code: [email or web address] URL: [email or web address] Date: 12/21/2023 Prepared by: Letitia Libra  Exercises - Seated Flexion Stretch  - 3 x daily - 7 x weekly - 1 sets - 10 reps - Standing Quadriceps Stretch  - 1 x daily - 7 x weekly - 3 sets - 30 sec  hold - Supine Lower Trunk Rotation  - 1 x daily - 7 x weekly - 3 sets - 10 reps - Hooklying Single Knee to Chest Stretch with Towel  - 1 x daily - 7 x weekly - 1 sets - 10 reps - 5 sec  hold - Supine Figure 4 Piriformis Stretch  - 1 x  daily - 7 x weekly - 3 sets - 30  hold - Quadruped Rocking Backward  - 1 x daily - 7 x weekly - 1 sets - 10 reps - 5 sec  hold - Supine Dead Bug with Leg Extension  - 1 x daily - 7 x weekly - 3 sets - 10 reps - Quadruped Alternating Leg Extensions  - 1 x daily - 7 x weekly - 2 sets - 10 reps  ASSESSMENT:  CLINICAL IMPRESSION: 12/28/2023 The patient  has been seen for 9 visits including today's visit and the initial evaluation. In the patient's words, "physical therapy is working". He is having much less pain when sleeping at night and any morning pain he has is improved with his home stretching. Short term goals were recently assessed and all were met. He has demonstrated improvements in hip ROM and trunk flexion ROM, though stiffness is present still in both of these regions - much of flexion with trunk flexion occurs above the lumbar spine. Modified Oswestry not re-administered today in error. In sum, patient subjectively reporting improvement with care, hip and trunk ROM measures have improved with care, and expect further improvement in symptoms and ROM with continuation of this plan of care. Physical therapy remains indicated.  Evaluation 11/23/2023: Patient is a 73 y.o. who was seen today for physical therapy evaluation and treatment for low back/R thigh pain. Stiffness of the lumbar spine and hips seems to be the biggest contributor to symptoms as the pain is only experienced during prolonged rest while sleeping. Strength is likely not an issue as patient tested strong and also does not have pain with activity - movement helps him loosen up and is relieving without any increase in pain with extended activity. Physical therapy will focus on improving the articular and soft tissue mobility of the lumbar spine (particularly into flexion) and hips to reduce symptoms and improve ability to sleep. Physical therapy is indicated.  OBJECTIVE IMPAIRMENTS: decreased mobility, decreased ROM, hypomobility, and pain.   ACTIVITY LIMITATIONS: sleeping  REHAB POTENTIAL: Good  CLINICAL DECISION MAKING: Stable/uncomplicated  EVALUATION COMPLEXITY: Low  GOALS: Goals reviewed with patient? yes  SHORT TERM GOALS: Target date: 12/21/2023   Patient will be independent in self management strategies to improve quality of life and functional outcomes. Baseline:  New Program Goal status: MET  2.  Patient will report at least 50% improvement in overall symptoms and/or function to demonstrate improved functional mobility Baseline: 0% better; 12/14/23: 50% Goal status: MET  3.  Patient will report at minimum he is able to sleep >4 hours on the Modified Oswestry questionnaire. Baseline: <4 hours; 12/14/23: 6 hours Goal status: MET  LONG TERM GOALS: 01/18/2024   Patient will report at least 75% improvement in overall symptoms and/or function to demonstrate improved functional mobility Baseline: 0% better Goal status: INITIAL  2.  Patient will report he is able to sleep > 6 hours on the Modified Oswestry questionnaire.  Baseline: <4 hours  Goal status: INITIAL  3.  Patient will demonstrate improve spinal mobility by being able to reach ankles with standing trunk flexion. Baseline: mid shin Goal status: INITIAL  PLAN:  PT FREQUENCY: 2x/week  PT DURATION: 8 weeks  PLANNED INTERVENTIONS: 97110-Therapeutic exercises, 97530- Therapeutic activity, [id number]- Neuromuscular re-education, 97535- Self Care, [id number]- Manual therapy, [phone]- Gait training, [phone]- Orthotic Fit/training, [phone]- Canalith repositioning, [id number]- Aquatic Therapy, [id number]- Electrical stimulation (unattended), [phone]- Ionotophoresis 4mg /ml Dexamethasone, Patient/Family education, Balance training, Stair training, Taping, Dry Needling, Joint mobilization, Joint manipulation,  Spinal manipulation, Spinal mobilization, Cryotherapy, and Moist heat   PLAN FOR NEXT SESSION: manual/symptom modification strategies PRN as indicated. Continue with lumbar/hip mobility work as appropriate, pt with tendency to prefer flexion. Continue with core/hip stability work as indicated.   Edmonia Caprio, PT, PhD, DPT  12/28/23 12:58 PM

## 2024-01-02 ENCOUNTER — Ambulatory Visit: Payer: Medicare Other

## 2024-01-02 DIAGNOSIS — M5459 Other low back pain: Secondary | ICD-10-CM | POA: Diagnosis not present

## 2024-01-02 DIAGNOSIS — M79651 Pain in right thigh: Secondary | ICD-10-CM

## 2024-01-02 DIAGNOSIS — M25652 Stiffness of left hip, not elsewhere classified: Secondary | ICD-10-CM

## 2024-01-02 DIAGNOSIS — M25651 Stiffness of right hip, not elsewhere classified: Secondary | ICD-10-CM

## 2024-01-02 NOTE — Therapy (Signed)
 OUTPATIENT PHYSICAL THERAPY THORACOLUMBAR   Patient Name: Benjamin Blair MRN: [id number] DOB:06/05/1951, 73 y.o., male Today's Date: 01/02/2024  END OF SESSION:  PT End of Session - 01/02/24 1102     Visit Number 10    Number of Visits 16    Date for PT Re-Evaluation 01/18/24    Authorization Type Medicare & [employer]    Progress Note Due on Visit 19    PT Start Time 1102    PT Stop Time 1143    PT Time Calculation (min) 41 min    Activity Tolerance Patient tolerated treatment well    Behavior During Therapy WFL for tasks assessed/performed             Past Medical History:  Diagnosis Date   Arrhythmia    atrial fibrillation   Chronic renal insufficiency, stage III (moderate) (HCC)    Clotting disorder (HCC)    pulmonary embolism and deep vein thrombosis   Diabetes mellitus without complication (HCC)    Heart murmur    Hypertension    Sleep apnea    wears C-PAP at night   Thoracic aortic dissection [facility])    Past Surgical History:  Procedure Laterality Date   REPAIR OF ACUTE ASCENDING THORACIC AORTIC DISSECTION     Patient Active Problem List   Diagnosis Date Noted   Hammer toes, bilateral 05/13/2020   Hav (hallux abducto valgus), left 02/07/2020   Hav (hallux abducto valgus), right 02/07/2020   Family history of glaucoma 08/08/2019   Diabetes mellitus without complication (HCC) 05/10/2019   Bleeding nose 02/28/2019   Elevated serum immunoglobulin free light chain level 08/21/2018   Hx of dissecting abdominal aortic aneurysm repair 02/03/2018   Morbid obesity (HCC) 02/03/2018   Type 2 diabetes mellitus without retinopathy (HCC) 02/03/2018   Partial small bowel obstruction (HCC) 02/01/2018   Glaucoma suspect of both eyes 07/13/2017   Hyperopia with astigmatism and presbyopia, bilateral 07/13/2017   Long term current use of oral hypoglycemic drug 07/13/2017   Nuclear sclerotic cataract of both eyes 07/13/2017   Vitreous floaters, bilateral 07/13/2017   Stage 3  chronic kidney disease (HCC) 02/10/2016   Proteinuria 02/10/2016   Cholelithiasis with acute on chronic cholecystitis 01/14/2016   Malignant neoplasm of lung (HCC) 12/28/2015   Hematuria 11/09/2015   Hemospermia 11/09/2015   Mixed incontinence 08/17/2015   Benign prostatic hyperplasia with lower urinary tract symptoms 07/20/2015   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   Incontinence 07/20/2015   Obesity (BMI 30.0-34.9) 12/12/2014   Diabetes (HCC) 11/17/2014   Essential hypertension 11/17/2014   Hyperlipidemia 11/17/2014   Acute pulmonary embolism (HCC) 11/17/2014   Atrial fibrillation (HCC) 11/17/2014   Deep vein thrombosis (HCC) 11/17/2014   Obstructive sleep apnea (adult) (pediatric) 11/17/2014   Aortic dissection (HCC) 09/16/2014   Hx of ascending aorta replacement 09/16/2014    PCP: Alain Honey, MD  REFERRING PROVIDER: Ceasar Lund, MD  REFERRING DIAG: lumbar radiculopathy, low back pain, lumbar spondylosis  Rationale for Evaluation and Treatment: Rehabilitation  THERAPY DIAG:  Other low back pain  Pain in right thigh  Stiffness of right hip, not elsewhere classified  Stiffness of left hip, not elsewhere classified  ONSET DATE: 5 months ago (referral date 11/16/2023)  SUBJECTIVE:  SUBJECTIVE STATEMENT: Patient continues to feel improvement with back pain/stiffness with stretches and PT; states he has plans to go for a hike tomorrow.   Evaluation 11/23/2023: Patient reported that about 5 months ago he started developing pain along the R hip/thigh region. He did not realize it was coming from his low back but at the visit with the Orthopedist, radiographs were taken and they discovered some stenosis. They trialed an injection and gabapentin, but neither helped with  the pain. They were planning on trying another medication similar to gabapentin but the patient declined due to the potential side effects. The pain is felt the most at night and first thing in the AM. Then, as he moves around and loosens up, the pain gets better. Sleeping on the side seems to be the most comfortable. Lying on the back is not uncomfortable at first, but then will become painful as the night goes on.  PERTINENT HISTORY:  No prior history of pain in this region or other pertinent history per patient report.   PAIN:  Are you having pain? Yes: NPRS scale: none currently; 6 at worst/10 Pain location: right anterior thigh Pain description: ache Aggravating factors: towards end of day Relieving factors: movement    PRECAUTIONS: None  RED FLAGS: None   WEIGHT BEARING RESTRICTIONS: No  FALLS:  Has patient fallen in last 6 months? No  PATIENT GOALS: resolve chief complaint  OBJECTIVE:  Note: Objective measures were completed at Evaluation unless otherwise noted.  DIAGNOSTIC FINDINGS:  Radiographs were taken at an outside facility - patient reported findings of stenosis  PATIENT SURVEYS:  Modified Oswestry: 5/50, 10% disability   COGNITION: Overall cognitive status: Within functional limits for tasks assessed    MUSCLE LENGTH: -Hamstrings: Right 70 deg; Left 70 deg 70 deg at knee 90/90 position -Thomas test: Right lacking 5 deg; Left lacking 5 deg; tested in sidelying with rectus femoris at length  PALPATION: No significant pain with central PA glides of the lumbar spine  LUMBAR ROM:   AROM eval 03/13  Flexion Mid shin Distal 1/3 of shin  Extension WNL   Right lateral flexion WNL   Left lateral flexion WNL   Right rotation    Left rotation     (Blank rows = not tested)  LOWER EXTREMITY ROM:     Passive  Right eval Left eval Right 3/13 Left 3/13  Hip flexion 90 90 100 100  Hip extension 0 0 0 0  Hip abduction      Hip adduction      Hip internal  rotation WNL WNL WNL WNL  Hip external rotation 25 25 30 30   Knee flexion      Knee extension      Ankle dorsiflexion      Ankle plantarflexion      Ankle inversion      Ankle eversion       (Blank rows = not tested)  LOWER EXTREMITY MMT:    MMT Right eval Left eval  Hip flexion 5 5  Hip extension 5 5  Hip abduction 5 5  Hip adduction 5 5  Hip internal rotation    Hip external rotation    Knee flexion    Knee extension    Ankle dorsiflexion    Ankle plantarflexion    Ankle inversion    Ankle eversion     (Blank rows = not tested)   [facility] Adult PT Treatment:  DATE: 01/02/2024 Therapeutic Exercise: Seated red PB roll out front x10, diagonals x5 each side Seated forward trunk flexion stretch  Dead bug isometric hold --> added ball b/w knees 90/90 leg hold + overhead reach back with 5#dowel --> added leg extension with arm reach back Figure 4 LTR Quadruped: Single arm raises (alt) Leg extension (alt) Bird dog  Forward rocking on green PB Runner's step up x10 (B) --> 6" step  Seated HS stretch w/strap 2x30" (B) Supine adductor stretch + mOswestry intake Neuromuscular re-ed: Pelvis mobility and proprioceptive awareness: Supine posterior PPT --> PPT into bridge Neutral pelvis bridge Marching bridges Foam beam: Side stepping Fwd tandem stepping Squats Standing hip hinge    [facility] Adult PT Treatment:                                                DATE: 12/28/2023 Therapeutic Exercise: Supine: R and L hip: manual stretching into flexion, flexion + abduction, flexion + adduction Manual stretching into double knees to chest, pressing at knees and pulling with strap behind knees Quadruped: Spinal flexion over green physioball, 3 x 30 sec holds Child's pose stretch, 3 x 30 sec holds Seated: Spinal flexion hugging orange physioball, x 10 Standing: Spinal flexion over strap at hips shut into top of door, x 10 Manual  Therapy: Hooklying: R and L hip: long axis posterolateral glides through femur Seated: Lumbar: mobilization with movement into flexion gliding at spinous processes Lumbar: mobilization with movement into flexion SNAG using mobilization belt   [facility] Adult PT Treatment:                                                DATE: 12/26/2023 Therapeutic Exercise: Seated red PB roll out (front) Seated forward trunk flexion stretch  Dead bug isometric hold Dead bug with arm/leg extensions 2x10 Standing quad stretch w/strap Neuromuscular re-ed: Supine: (proprioceptive awareness) Posterior pelvic tilt x15 Core marching in PPT x10 Bridges with PPT + ball b/w knees for hup add iso activation x10 Bent knee fall out + blue TB (unilateral pelvic stabilization) x15 (B) Seated hip hinge  Standing squat with focus on hip hinge mechanics    PATIENT EDUCATION:  Education details: see treatment; HEP update  Person educated: Patient Education method: Explanation, Demonstration, and Handouts Education comprehension: verbalized understanding   HOME EXERCISE PROGRAM: Access Code: [email or web address] URL: [email or web address] Date: 12/21/2023 Prepared by: Letitia Libra  Exercises - Seated Flexion Stretch  - 3 x daily - 7 x weekly - 1 sets - 10 reps - Standing Quadriceps Stretch  - 1 x daily - 7 x weekly - 3 sets - 30 sec  hold - Supine Lower Trunk Rotation  - 1 x daily - 7 x weekly - 3 sets - 10 reps - Hooklying Single Knee to Chest Stretch with Towel  - 1 x daily - 7 x weekly - 1 sets - 10 reps - 5 sec  hold - Supine Figure 4 Piriformis Stretch  - 1 x daily - 7 x weekly - 3 sets - 30  hold - Quadruped Rocking Backward  - 1 x daily - 7 x weekly - 1 sets - 10 reps - 5 sec  hold - Supine Dead Bug with  Leg Extension  - 1 x daily - 7 x weekly - 3 sets - 10 reps - Quadruped Alternating Leg Extensions  - 1 x daily - 7 x weekly - 2 sets - 10 reps  ASSESSMENT:  CLINICAL IMPRESSION: Core strengthening  continued with focus on TA activation and incorporating dynamic components to challenge postural stability. Dynamic balance and postural/core stability challenged with single leg activities and compliant surfaces. Posterior pelvic tilt exercise discontinued due to lumbar discomfort; cueing neutral pelvis during bridging exercise alleviated symptoms.  Evaluation 11/23/2023: Patient is a 73 y.o. who was seen today for physical therapy evaluation and treatment for low back/R thigh pain. Stiffness of the lumbar spine and hips seems to be the biggest contributor to symptoms as the pain is only experienced during prolonged rest while sleeping. Strength is likely not an issue as patient tested strong and also does not have pain with activity - movement helps him loosen up and is relieving without any increase in pain with extended activity. Physical therapy will focus on improving the articular and soft tissue mobility of the lumbar spine (particularly into flexion) and hips to reduce symptoms and improve ability to sleep. Physical therapy is indicated.  OBJECTIVE IMPAIRMENTS: decreased mobility, decreased ROM, hypomobility, and pain.   ACTIVITY LIMITATIONS: sleeping  REHAB POTENTIAL: Good  CLINICAL DECISION MAKING: Stable/uncomplicated  EVALUATION COMPLEXITY: Low  GOALS: Goals reviewed with patient? yes  SHORT TERM GOALS: Target date: 12/21/2023   Patient will be independent in self management strategies to improve quality of life and functional outcomes. Baseline: New Program Goal status: MET  2.  Patient will report at least 50% improvement in overall symptoms and/or function to demonstrate improved functional mobility Baseline: 0% better; 12/14/23: 50% Goal status: MET  3.  Patient will report at minimum he is able to sleep >4 hours on the Modified Oswestry questionnaire. Baseline: <4 hours; 12/14/23: 6 hours Goal status: MET  LONG TERM GOALS: 01/18/2024   Patient will report at least 75%  improvement in overall symptoms and/or function to demonstrate improved functional mobility Baseline: 0% better; 01/02/24: 80% Goal status: MET  2.  Patient will report he is able to sleep > 6 hours on the Modified Oswestry questionnaire.  Baseline: <4 hours; 01/02/24: 6.5 hours  Goal status: MET  3.  Patient will demonstrate improve spinal mobility by being able to reach ankles with standing trunk flexion. Baseline: mid shin; 01/02/24: ankles Goal status: MET  PLAN:  PT FREQUENCY: 2x/week  PT DURATION: 8 weeks  PLANNED INTERVENTIONS: 97110-Therapeutic exercises, 97530- Therapeutic activity, [id number]- Neuromuscular re-education, 97535- Self Care, [id number]- Manual therapy, [phone]- Gait training, [phone]- Orthotic Fit/training, [phone]- Canalith repositioning, [id number]- Aquatic Therapy, 97014- Electrical stimulation (unattended), [phone]- Ionotophoresis 4mg /ml Dexamethasone, Patient/Family education, Balance training, Stair training, Taping, Dry Needling, Joint mobilization, Joint manipulation, Spinal manipulation, Spinal mobilization, Cryotherapy, and Moist heat   PLAN FOR NEXT SESSION: manual/symptom modification strategies PRN as indicated. Continue with lumbar/hip mobility work as appropriate, pt with tendency to prefer flexion. Continue with core/hip stability work as indicated. (D/C early if patient is interested)  Carlynn Herald, PTA 01/02/24 11:44 AM

## 2024-01-03 NOTE — Therapy (Signed)
 OUTPATIENT PHYSICAL THERAPY THORACOLUMBAR   Patient Name: Mars Scheaffer MRN: [id number] DOB:15-May-1951, 73 y.o., male Today's Date: 01/03/2024  END OF SESSION:    Past Medical History:  Diagnosis Date   Arrhythmia    atrial fibrillation   Chronic renal insufficiency, stage III (moderate) (HCC)    Clotting disorder (HCC)    pulmonary embolism and deep vein thrombosis   Diabetes mellitus without complication (HCC)    Heart murmur    Hypertension    Sleep apnea    wears C-PAP at night   Thoracic aortic dissection [facility])    Past Surgical History:  Procedure Laterality Date   REPAIR OF ACUTE ASCENDING THORACIC AORTIC DISSECTION     Patient Active Problem List   Diagnosis Date Noted   Hammer toes, bilateral 05/13/2020   Hav (hallux abducto valgus), left 02/07/2020   Hav (hallux abducto valgus), right 02/07/2020   Family history of glaucoma 08/08/2019   Diabetes mellitus without complication (HCC) 05/10/2019   Bleeding nose 02/28/2019   Elevated serum immunoglobulin free light chain level 08/21/2018   Hx of dissecting abdominal aortic aneurysm repair 02/03/2018   Morbid obesity (HCC) 02/03/2018   Type 2 diabetes mellitus without retinopathy (HCC) 02/03/2018   Partial small bowel obstruction (HCC) 02/01/2018   Glaucoma suspect of both eyes 07/13/2017   Hyperopia with astigmatism and presbyopia, bilateral 07/13/2017   Long term current use of oral hypoglycemic drug 07/13/2017   Nuclear sclerotic cataract of both eyes 07/13/2017   Vitreous floaters, bilateral 07/13/2017   Stage 3 chronic kidney disease (HCC) 02/10/2016   Proteinuria 02/10/2016   Cholelithiasis with acute on chronic cholecystitis 01/14/2016   Malignant neoplasm of lung (HCC) 12/28/2015   Hematuria 11/09/2015   Hemospermia 11/09/2015   Mixed incontinence 08/17/2015   Benign prostatic hyperplasia with lower urinary tract symptoms 07/20/2015   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    Incontinence 07/20/2015   Obesity (BMI 30.0-34.9) 12/12/2014   Diabetes (HCC) 11/17/2014   Essential hypertension 11/17/2014   Hyperlipidemia 11/17/2014   Acute pulmonary embolism (HCC) 11/17/2014   Atrial fibrillation (HCC) 11/17/2014   Deep vein thrombosis (HCC) 11/17/2014   Obstructive sleep apnea (adult) (pediatric) 11/17/2014   Aortic dissection (HCC) 09/16/2014   Hx of ascending aorta replacement 09/16/2014    PCP: Alain Honey, MD  REFERRING PROVIDER: Ceasar Lund, MD  REFERRING DIAG: lumbar radiculopathy, low back pain, lumbar spondylosis  Rationale for Evaluation and Treatment: Rehabilitation  THERAPY DIAG:  No diagnosis found.  ONSET DATE: 5 months ago (referral date 11/16/2023)  SUBJECTIVE:  SUBJECTIVE STATEMENT: ***  Evaluation 11/23/2023: Patient reported that about 5 months ago he started developing pain along the R hip/thigh region. He did not realize it was coming from his low back but at the visit with the Orthopedist, radiographs were taken and they discovered some stenosis. They trialed an injection and gabapentin, but neither helped with the pain. They were planning on trying another medication similar to gabapentin but the patient declined due to the potential side effects. The pain is felt the most at night and first thing in the AM. Then, as he moves around and loosens up, the pain gets better. Sleeping on the side seems to be the most comfortable. Lying on the back is not uncomfortable at first, but then will become painful as the night goes on.  PERTINENT HISTORY:  No prior history of pain in this region or other pertinent history per patient report.   PAIN:  Are you having pain? Yes: NPRS scale: none currently; 6 at worst/10 Pain location: right anterior thigh Pain  description: ache Aggravating factors: towards end of day Relieving factors: movement    PRECAUTIONS: None  RED FLAGS: None   WEIGHT BEARING RESTRICTIONS: No  FALLS:  Has patient fallen in last 6 months? No  PATIENT GOALS: resolve chief complaint  OBJECTIVE:  Note: Objective measures were completed at Evaluation unless otherwise noted.  DIAGNOSTIC FINDINGS:  Radiographs were taken at an outside facility - patient reported findings of stenosis  PATIENT SURVEYS:  Modified Oswestry: 5/50, 10% disability   COGNITION: Overall cognitive status: Within functional limits for tasks assessed    MUSCLE LENGTH: -Hamstrings: Right 70 deg; Left 70 deg 70 deg at knee 90/90 position -Thomas test: Right lacking 5 deg; Left lacking 5 deg; tested in sidelying with rectus femoris at length  PALPATION: No significant pain with central PA glides of the lumbar spine  LUMBAR ROM:   AROM eval 03/13  Flexion Mid shin Distal 1/3 of shin  Extension WNL   Right lateral flexion WNL   Left lateral flexion WNL   Right rotation    Left rotation     (Blank rows = not tested)  LOWER EXTREMITY ROM:     Passive  Right eval Left eval Right 3/13 Left 3/13  Hip flexion 90 90 100 100  Hip extension 0 0 0 0  Hip abduction      Hip adduction      Hip internal rotation WNL WNL WNL WNL  Hip external rotation 25 25 30 30   Knee flexion      Knee extension      Ankle dorsiflexion      Ankle plantarflexion      Ankle inversion      Ankle eversion       (Blank rows = not tested)  LOWER EXTREMITY MMT:    MMT Right eval Left eval  Hip flexion 5 5  Hip extension 5 5  Hip abduction 5 5  Hip adduction 5 5  Hip internal rotation    Hip external rotation    Knee flexion    Knee extension    Ankle dorsiflexion    Ankle plantarflexion    Ankle inversion    Ankle eversion     (Blank rows = not tested)   [facility] Adult PT Treatment:  DATE:  01/04/2024 Therapeutic Exercise: D/C??  Seated red PB roll out front x10, diagonals x5 each side Seated forward trunk flexion stretch  Dead bug isometric hold --> added ball b/w knees 90/90 leg hold + overhead reach back with 5#dowel --> added leg extension with arm reach back Figure 4 LTR Quadruped: Single arm raises (alt) Leg extension (alt) Bird dog  Forward rocking on green PB Runner's step up x10 (B) --> 6" step  Seated HS stretch w/strap 2x30" (B) Supine adductor stretch + mOswestry intake Neuromuscular re-ed: Pelvis mobility and proprioceptive awareness: Supine posterior PPT --> PPT into bridge Neutral pelvis bridge Marching bridges Foam beam: Side stepping Fwd tandem stepping Squats Standing hip hinge   [facility] Adult PT Treatment:                                                DATE: 01/02/2024 Therapeutic Exercise: Seated red PB roll out front x10, diagonals x5 each side Seated forward trunk flexion stretch  Dead bug isometric hold --> added ball b/w knees 90/90 leg hold + overhead reach back with 5#dowel --> added leg extension with arm reach back Figure 4 LTR Quadruped: Single arm raises (alt) Leg extension (alt) Bird dog  Forward rocking on green PB Runner's step up x10 (B) --> 6" step  Seated HS stretch w/strap 2x30" (B) Supine adductor stretch + mOswestry intake Neuromuscular re-ed: Pelvis mobility and proprioceptive awareness: Supine posterior PPT --> PPT into bridge Neutral pelvis bridge Marching bridges Foam beam: Side stepping Fwd tandem stepping Squats Standing hip hinge    [facility] Adult PT Treatment:                                                DATE: 12/28/2023 Therapeutic Exercise: Supine: R and L hip: manual stretching into flexion, flexion + abduction, flexion + adduction Manual stretching into double knees to chest, pressing at knees and pulling with strap behind knees Quadruped: Spinal flexion over green physioball, 3 x 30 sec  holds Child's pose stretch, 3 x 30 sec holds Seated: Spinal flexion hugging orange physioball, x 10 Standing: Spinal flexion over strap at hips shut into top of door, x 10 Manual Therapy: Hooklying: R and L hip: long axis posterolateral glides through femur Seated: Lumbar: mobilization with movement into flexion gliding at spinous processes Lumbar: mobilization with movement into flexion SNAG using mobilization belt   [facility] Adult PT Treatment:                                                DATE: 12/26/2023 Therapeutic Exercise: Seated red PB roll out (front) Seated forward trunk flexion stretch  Dead bug isometric hold Dead bug with arm/leg extensions 2x10 Standing quad stretch w/strap Neuromuscular re-ed: Supine: (proprioceptive awareness) Posterior pelvic tilt x15 Core marching in PPT x10 Bridges with PPT + ball b/w knees for hup add iso activation x10 Bent knee fall out + blue TB (unilateral pelvic stabilization) x15 (B) Seated hip hinge  Standing squat with focus on hip hinge mechanics    PATIENT EDUCATION:  Education details: see treatment; HEP  update  Person educated: Patient Education method: Explanation, Demonstration, and Handouts Education comprehension: verbalized understanding   HOME EXERCISE PROGRAM: Access Code: [email or web address] URL: [email or web address] Date: 12/21/2023 Prepared by: Letitia Libra  Exercises - Seated Flexion Stretch  - 3 x daily - 7 x weekly - 1 sets - 10 reps - Standing Quadriceps Stretch  - 1 x daily - 7 x weekly - 3 sets - 30 sec  hold - Supine Lower Trunk Rotation  - 1 x daily - 7 x weekly - 3 sets - 10 reps - Hooklying Single Knee to Chest Stretch with Towel  - 1 x daily - 7 x weekly - 1 sets - 10 reps - 5 sec  hold - Supine Figure 4 Piriformis Stretch  - 1 x daily - 7 x weekly - 3 sets - 30  hold - Quadruped Rocking Backward  - 1 x daily - 7 x weekly - 1 sets - 10 reps - 5 sec  hold - Supine Dead Bug with Leg Extension  - 1  x daily - 7 x weekly - 3 sets - 10 reps - Quadruped Alternating Leg Extensions  - 1 x daily - 7 x weekly - 2 sets - 10 reps  ASSESSMENT:  CLINICAL IMPRESSION: ***  Evaluation 11/23/2023: Patient is a 73 y.o. who was seen today for physical therapy evaluation and treatment for low back/R thigh pain. Stiffness of the lumbar spine and hips seems to be the biggest contributor to symptoms as the pain is only experienced during prolonged rest while sleeping. Strength is likely not an issue as patient tested strong and also does not have pain with activity - movement helps him loosen up and is relieving without any increase in pain with extended activity. Physical therapy will focus on improving the articular and soft tissue mobility of the lumbar spine (particularly into flexion) and hips to reduce symptoms and improve ability to sleep. Physical therapy is indicated.  OBJECTIVE IMPAIRMENTS: decreased mobility, decreased ROM, hypomobility, and pain.   ACTIVITY LIMITATIONS: sleeping  REHAB POTENTIAL: Good  CLINICAL DECISION MAKING: Stable/uncomplicated  EVALUATION COMPLEXITY: Low  GOALS: Goals reviewed with patient? yes  SHORT TERM GOALS: Target date: 12/21/2023   Patient will be independent in self management strategies to improve quality of life and functional outcomes. Baseline: New Program Goal status: MET  2.  Patient will report at least 50% improvement in overall symptoms and/or function to demonstrate improved functional mobility Baseline: 0% better; 12/14/23: 50% Goal status: MET  3.  Patient will report at minimum he is able to sleep >4 hours on the Modified Oswestry questionnaire. Baseline: <4 hours; 12/14/23: 6 hours Goal status: MET  LONG TERM GOALS: 01/18/2024   Patient will report at least 75% improvement in overall symptoms and/or function to demonstrate improved functional mobility Baseline: 0% better; 01/02/24: 80% Goal status: MET  2.  Patient will report he is able to  sleep > 6 hours on the Modified Oswestry questionnaire.  Baseline: <4 hours; 01/02/24: 6.5 hours  Goal status: MET  3.  Patient will demonstrate improve spinal mobility by being able to reach ankles with standing trunk flexion. Baseline: mid shin; 01/02/24: ankles Goal status: MET  PLAN:  PT FREQUENCY: 2x/week  PT DURATION: 8 weeks  PLANNED INTERVENTIONS: 97110-Therapeutic exercises, 97530- Therapeutic activity, [id number]- Neuromuscular re-education, 97535- Self Care, 28413- Manual therapy, [phone]- Gait training, [phone]- Orthotic Fit/training, [phone]- Canalith repositioning, [id number]- Aquatic Therapy, 97014- Electrical stimulation (unattended), [phone]- Ionotophoresis 4mg /ml Dexamethasone, Patient/Family education, Balance training, Stair  training, Taping, Dry Needling, Joint mobilization, Joint manipulation, Spinal manipulation, Spinal mobilization, Cryotherapy, and Moist heat   PLAN FOR NEXT SESSION: manual/symptom modification strategies PRN as indicated. Continue with lumbar/hip mobility work as appropriate, pt with tendency to prefer flexion. Continue with core/hip stability work as indicated. (D/C early if patient is interested)  Solon Palm, PT 01/03/24 8:51 PM

## 2024-01-04 ENCOUNTER — Ambulatory Visit: Payer: Medicare Other | Admitting: Physical Therapy

## 2024-01-04 ENCOUNTER — Encounter: Payer: Self-pay | Admitting: Physical Therapy

## 2024-01-04 DIAGNOSIS — M25652 Stiffness of left hip, not elsewhere classified: Secondary | ICD-10-CM

## 2024-01-04 DIAGNOSIS — M25651 Stiffness of right hip, not elsewhere classified: Secondary | ICD-10-CM

## 2024-01-04 DIAGNOSIS — M5459 Other low back pain: Secondary | ICD-10-CM

## 2024-01-04 DIAGNOSIS — M79651 Pain in right thigh: Secondary | ICD-10-CM

## 2024-01-09 ENCOUNTER — Ambulatory Visit: Payer: Medicare Other

## 2024-02-22 ENCOUNTER — Encounter: Payer: Self-pay | Admitting: Podiatry

## 2024-02-22 ENCOUNTER — Ambulatory Visit (INDEPENDENT_AMBULATORY_CARE_PROVIDER_SITE_OTHER): Payer: Medicare Other | Admitting: Podiatry

## 2024-02-22 DIAGNOSIS — E1169 Type 2 diabetes mellitus with other specified complication: Secondary | ICD-10-CM

## 2024-02-22 DIAGNOSIS — M79676 Pain in unspecified toe(s): Secondary | ICD-10-CM

## 2024-02-22 DIAGNOSIS — B351 Tinea unguium: Secondary | ICD-10-CM

## 2024-02-22 NOTE — Progress Notes (Signed)
 Subjective: Benjamin Blair is a 73 y.o. male patient with history of diabetes who presents to office today with a chief concern of long,mildly painful nails  while ambulating in shoes; unable to trim.  Patient denies any new cramping, numbness, burning or tingling in the legs. He has a history of DVT and relates no problems recently.  Patient denies any new changes in medication or new problems.  Mirta Ammon, MD  is his primary care provider.  Patient Active Problem List   Diagnosis Date Noted   Hammer toes, bilateral 05/13/2020   Hav (hallux abducto valgus), left 02/07/2020   Hav (hallux abducto valgus), right 02/07/2020   Family history of glaucoma 08/08/2019   Diabetes mellitus without complication (HCC) 05/10/2019   Bleeding nose 02/28/2019   Elevated serum immunoglobulin free light chain level 08/21/2018   Hx of dissecting abdominal aortic aneurysm repair 02/03/2018   Morbid obesity (HCC) 02/03/2018   Type 2 diabetes mellitus without retinopathy (HCC) 02/03/2018   Partial small bowel obstruction (HCC) 02/01/2018   Glaucoma suspect of both eyes 07/13/2017   Hyperopia with astigmatism and presbyopia, bilateral 07/13/2017   Long term current use of oral hypoglycemic drug 07/13/2017   Nuclear sclerotic cataract of both eyes 07/13/2017   Vitreous floaters, bilateral 07/13/2017   Stage 3 chronic kidney disease (HCC) 02/10/2016   Proteinuria 02/10/2016   Cholelithiasis with acute on chronic cholecystitis 01/14/2016   Malignant neoplasm of lung (HCC) 12/28/2015   Hematuria 11/09/2015   Hemospermia 11/09/2015   Mixed incontinence 08/17/2015   Benign prostatic hyperplasia with lower urinary tract symptoms 07/20/2015   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   Incontinence 07/20/2015   Obesity (BMI 30.0-34.9) 12/12/2014   Diabetes (HCC) 11/17/2014   Essential hypertension 11/17/2014   Hyperlipidemia 11/17/2014   Acute pulmonary embolism (HCC) 11/17/2014   Atrial  fibrillation (HCC) 11/17/2014   Deep vein thrombosis (HCC) 11/17/2014   Obstructive sleep apnea (adult) (pediatric) 11/17/2014   Aortic dissection (HCC) 09/16/2014   Hx of ascending aorta replacement 09/16/2014   Current Outpatient Medications on File Prior to Visit  Medication Sig Dispense Refill   amiodarone  (PACERONE ) 200 MG tablet Take 200 mg by mouth daily.   6   amiodarone  (PACERONE ) 200 MG tablet Take 1 tablet by mouth daily.     amLODipine (NORVASC) 10 MG tablet Take one every morning     amLODipine (NORVASC) 10 MG tablet Take 1 tablet by mouth every morning.     Ascorbic Acid (VITAMIN C) 1000 MG tablet Take by mouth.     aspirin 81 MG EC tablet Take 1 tablet by mouth daily.     aspirin EC 81 MG tablet Take 81 mg by mouth daily.     Blood Glucose Monitoring Suppl (GLUCOCOM BLOOD GLUCOSE MONITOR) DEVI Patient check blood glucose twice a day     Cholecalciferol 25 MCG (1000 UT) tablet Take by mouth. (Patient not taking: Reported on 11/23/2023)     cloNIDine  (CATAPRES ) 0.2 MG tablet Take 0.2 mg by mouth 3 (three) times daily.     Continuous Blood Gluc Receiver (FREESTYLE LIBRE 14 DAY READER) DEVI      Continuous Blood Gluc Receiver (FREESTYLE LIBRE READER) DEVI Scan as needed.     Continuous Blood Gluc Sensor (FREESTYLE LIBRE SENSOR SYSTEM) MISC Inject into the skin.     diphenhydrAMINE  (BENADRYL ) 25 MG tablet Take 2 tablets (50 mg total) by mouth every 6 (six) hours as needed for itching (hives). 20 tablet 0  FREESTYLE LITE test strip 2 (two) times daily.     labetalol  (NORMODYNE ) 300 MG tablet 1 tablet by mouth 3 times a day     labetalol  (NORMODYNE ) 300 MG tablet Take 1 tablet by mouth 3 (three) times daily.     latanoprost (XALATAN) 0.005 % ophthalmic solution Place 1 drop into the left eye at bedtime.     linagliptin (TRADJENTA) 5 MG TABS tablet Take 5 mg by mouth daily. (Patient not taking: Reported on 11/23/2023)     lisinopril  (ZESTRIL ) 40 MG tablet SMARTSIG:1 Tablet(s) By Mouth  Daily     simvastatin (ZOCOR) 20 MG tablet Take 1 tablet by mouth at bedtime.     UNABLE TO FIND Take by mouth.     UNABLE TO FIND Inject into the skin.     UNABLE TO FIND Scan as needed.     UNABLE TO FIND 1 strip by Misc.(Non-Drug; Combo Route) route in the morning and at bedtime. FreeStyle Lite Blood Glucose Test Strips     warfarin (COUMADIN) 4 MG tablet Take 4 mg by mouth daily.   1   No current facility-administered medications on file prior to visit.   Allergies  Allergen Reactions   Linagliptin Rash and Hives   Iodinated Contrast Media Other (See Comments)    Intolerance per patient Interaction with cardiac medication      Objective: General Appearance  Alert, conversant and in no acute stress.   Vascular  Dorsalis pedis and posterior tibial  pulses are palpable  bilaterally.  Capillary return is within normal limits  bilaterally. Temperature is within normal limits  bilaterally.   Neurologic  Senn-Weinstein monofilament wire test diminished   bilaterally. Muscle power within normal limits bilaterally.   Nails Thick disfigured discolored nails with subungual debris  from second  to fifth toes bilaterally. No evidence of bacterial infection or drainage bilaterally.   Orthopedic  No limitations of motion  feet .  No crepitus or effusions noted.  No bony pathology or digital deformities noted.  HAV  B/L.  Hammer toes 2-5  B/L.   Skin  normotropic skin with no porokeratosis noted bilaterally.  No signs of infections or ulcers noted.      Assessment and Plan: Problem List Items Addressed This Visit       Endocrine   Diabetes (HCC)   Other Visit Diagnoses       Pain due to onychomycosis of toenail    -  Primary         ICD-10-CM   1. Pain due to onychomycosis of toenail  B35.1    M79.676     2. Type 2 diabetes mellitus with other specified complication, without long-term current use of insulin (HCC)  E11.69         -Examined patient. -Mechanically debrided  all nails 2-5 bilateral using sterile nail nipper and filed with dremel without incident  -Answered all patient questions -Patient to return  in 3 months for at risk foot care -Patient advised to call the office if any problems or questions arise in the meantime.  Jennefer Moats, DPM

## 2024-05-24 ENCOUNTER — Ambulatory Visit: Admitting: Podiatry

## 2024-05-24 ENCOUNTER — Encounter: Payer: Self-pay | Admitting: Podiatry

## 2024-05-24 DIAGNOSIS — E1169 Type 2 diabetes mellitus with other specified complication: Secondary | ICD-10-CM | POA: Diagnosis not present

## 2024-05-24 DIAGNOSIS — M79676 Pain in unspecified toe(s): Secondary | ICD-10-CM

## 2024-05-24 DIAGNOSIS — E119 Type 2 diabetes mellitus without complications: Secondary | ICD-10-CM

## 2024-05-24 DIAGNOSIS — B351 Tinea unguium: Secondary | ICD-10-CM | POA: Diagnosis not present

## 2024-05-24 NOTE — Progress Notes (Signed)
  Subjective:  Patient ID: Benjamin Blair, male    DOB: 1951-05-20,   MRN: [id number]  Chief Complaint  Patient presents with   Diabetes    Nail trim      73 y.o. male presents for concern of thickened elongated and painful nails that are difficult to trim. Requesting to have them trimmed today. Relates burning and tingling in their feet. Patient is diabetic and last A1c was No results found for: HGBA1C .   PCP:  Corean Ade, MD    . Denies any other pedal complaints. Denies n/v/f/c.   Past Medical History:  Diagnosis Date   Arrhythmia    atrial fibrillation   Chronic renal insufficiency, stage III (moderate) (HCC)    Clotting disorder (HCC)    pulmonary embolism and deep vein thrombosis   Diabetes mellitus without complication (HCC)    Heart murmur    Hypertension    Sleep apnea    wears C-PAP at night   Thoracic aortic dissection (HCC)     Objective:  Physical Exam: Vascular: DP/PT pulses 2/4 bilateral. CFT <3 seconds. Absent hair growth on digits. Edema noted to bilateral lower extremities. Xerosis noted bilaterally.  Skin. No lacerations or abrasions bilateral feet. Nails 1-5 bilateral  are thickened discolored and elongated with subungual debris.  Musculoskeletal: MMT 5/5 bilateral lower extremities in DF, PF, Inversion and Eversion. Deceased ROM in DF of ankle joint.  Neurological: Sensation intact to light touch. Protective sensation diminished bilateral.    Assessment:   1. Pain due to onychomycosis of toenail   2. Type 2 diabetes mellitus with other specified complication, without long-term current use of insulin (HCC)      Plan:  Patient was evaluated and treated and all questions answered. -Discussed and educated patient on diabetic foot care, especially with  regards to the vascular, neurological and musculoskeletal systems.  -Stressed the importance of good glycemic control and the detriment of not  controlling glucose levels in relation to the  foot. -Discussed supportive shoes at all times and checking feet regularly.  -Mechanically debrided all nails 1-5 bilateral using sterile nail nipper and filed with dremel without incident  -Answered all patient questions -Patient to return  in 3 months for at risk foot care -Patient advised to call the office if any problems or questions arise in the meantime.   Asberry Failing, DPM

## 2024-08-23 ENCOUNTER — Ambulatory Visit: Admitting: Podiatry

## 2024-08-23 ENCOUNTER — Encounter: Payer: Self-pay | Admitting: Podiatry

## 2024-08-23 DIAGNOSIS — E1169 Type 2 diabetes mellitus with other specified complication: Secondary | ICD-10-CM

## 2024-08-23 DIAGNOSIS — M79676 Pain in unspecified toe(s): Secondary | ICD-10-CM

## 2024-08-23 DIAGNOSIS — B351 Tinea unguium: Secondary | ICD-10-CM

## 2024-08-23 NOTE — Progress Notes (Signed)
  Subjective:  Patient ID: Benjamin Blair, male    DOB: 05-Dec-1950,   MRN: [id number]  Chief Complaint  Patient presents with   Diabetes    Trim the nails.  Saw Dr. Asberry Gals - 04/16/2024; A1c - 6.0    73 y.o. male presents for concern of thickened elongated and painful nails that are difficult to trim. Requesting to have them trimmed today. Relates burning and tingling in their feet. Patient is diabetic and last A1c was No results found for: HGBA1C .   PCP:  Corean Ade, MD    . Denies any other pedal complaints. Denies n/v/f/c.   Past Medical History:  Diagnosis Date   Arrhythmia    atrial fibrillation   Chronic renal insufficiency, stage III (moderate)    Clotting disorder    pulmonary embolism and deep vein thrombosis   Diabetes mellitus without complication (HCC)    Heart murmur    Hypertension    Sleep apnea    wears C-PAP at night   Thoracic aortic dissection (HCC)     Objective:  Physical Exam: Vascular: DP/PT pulses 2/4 bilateral. CFT <3 seconds. Absent hair growth on digits. Edema noted to bilateral lower extremities. Xerosis noted bilaterally.  Skin. No lacerations or abrasions bilateral feet. Nails 1-5 bilateral  are thickened discolored and elongated with subungual debris.  Musculoskeletal: MMT 5/5 bilateral lower extremities in DF, PF, Inversion and Eversion. Deceased ROM in DF of ankle joint.  Neurological: Sensation intact to light touch. Protective sensation diminished bilateral.    Assessment:   1. Pain due to onychomycosis of toenail   2. Type 2 diabetes mellitus with other specified complication, without long-term current use of insulin (HCC)       Plan:  Patient was evaluated and treated and all questions answered. -Discussed and educated patient on diabetic foot care, especially with  regards to the vascular, neurological and musculoskeletal systems.  -Stressed the importance of good glycemic control and the detriment of not   controlling glucose levels in relation to the foot. -Discussed supportive shoes at all times and checking feet regularly.  -Mechanically debrided all nails 1-5 bilateral using sterile nail nipper and filed with dremel without incident  -Answered all patient questions -Patient to return  in 3 months for at risk foot care -Patient advised to call the office if any problems or questions arise in the meantime.   Asberry Failing, DPM

## 2024-11-22 ENCOUNTER — Ambulatory Visit: Admitting: Podiatry

## 2024-11-22 ENCOUNTER — Encounter: Payer: Self-pay | Admitting: Podiatry

## 2024-11-22 DIAGNOSIS — B351 Tinea unguium: Secondary | ICD-10-CM

## 2024-11-22 DIAGNOSIS — E1169 Type 2 diabetes mellitus with other specified complication: Secondary | ICD-10-CM

## 2024-11-22 NOTE — Progress Notes (Signed)
"  °  Subjective:  Patient ID: Benjamin Blair, male    DOB: April 15, 1951,   MRN: [id number]  Chief Complaint  Patient presents with   Diabetes    Nails Saw Dr. Asberry Gals - 04/16/2024; A1c - 6.0    74 y.o. male presents for concern of thickened elongated and painful nails that are difficult to trim. Requesting to have them trimmed today. Relates burning and tingling in their feet. Patient is diabetic and last A1c was No results found for: HGBA1C .   PCP:  Corean Ade, MD    . Denies any other pedal complaints. Denies n/v/f/c.   Past Medical History:  Diagnosis Date   Arrhythmia    atrial fibrillation   Chronic renal insufficiency, stage III (moderate)    Clotting disorder    pulmonary embolism and deep vein thrombosis   Diabetes mellitus without complication (HCC)    Heart murmur    Hypertension    Sleep apnea    wears C-PAP at night   Thoracic aortic dissection (HCC)     Objective:  Physical Exam: Vascular: DP/PT pulses 2/4 bilateral. CFT <3 seconds. Absent hair growth on digits. Edema noted to bilateral lower extremities. Xerosis noted bilaterally.  Skin. No lacerations or abrasions bilateral feet. Nails 1-5 bilateral  are thickened discolored and elongated with subungual debris.  Musculoskeletal: MMT 5/5 bilateral lower extremities in DF, PF, Inversion and Eversion. Deceased ROM in DF of ankle joint.  Neurological: Sensation intact to light touch. Protective sensation diminished bilateral.    Assessment:   1. Pain due to onychomycosis of toenail   2. Type 2 diabetes mellitus with other specified complication, without long-term current use of insulin (HCC)        Plan:  Patient was evaluated and treated and all questions answered. -Discussed and educated patient on diabetic foot care, especially with  regards to the vascular, neurological and musculoskeletal systems.  -Stressed the importance of good glycemic control and the detriment of not  controlling  glucose levels in relation to the foot. -Discussed supportive shoes at all times and checking feet regularly.  -Mechanically debrided all nails 1-5 bilateral using sterile nail nipper and filed with dremel without incident  -Answered all patient questions -Patient to return  in 3 months for at risk foot care -Patient advised to call the office if any problems or questions arise in the meantime.   Asberry Failing, DPM    "

## 2025-02-21 ENCOUNTER — Ambulatory Visit: Admitting: Podiatry
